# Patient Record
Sex: Male | Born: 1980 | Race: Black or African American | Hispanic: No | Marital: Single | Smoking: Light tobacco smoker
Health system: Southern US, Community
[De-identification: ages and names within clinical notes are randomized; demographics above are authoritative.]

## PROBLEM LIST (undated history)

## (undated) DIAGNOSIS — F1290 Cannabis use, unspecified, uncomplicated: Secondary | ICD-10-CM

---

## 2018-11-04 ENCOUNTER — Emergency Department (HOSPITAL_COMMUNITY): Payer: Self-pay

## 2018-11-04 ENCOUNTER — Encounter (HOSPITAL_COMMUNITY): Payer: Self-pay | Admitting: Emergency Medicine

## 2018-11-04 ENCOUNTER — Inpatient Hospital Stay (HOSPITAL_COMMUNITY)
Admission: EM | Admit: 2018-11-04 | Discharge: 2018-11-07 | DRG: 176 | Disposition: A | Discharge status: Home / Self Care | Payer: Self-pay | Attending: Internal Medicine | Admitting: Internal Medicine

## 2018-11-04 DIAGNOSIS — I129 Hypertensive chronic kidney disease with stage 1 through stage 4 chronic kidney disease, or unspecified chronic kidney disease: Secondary | ICD-10-CM | POA: Diagnosis present

## 2018-11-04 DIAGNOSIS — Z79899 Other long term (current) drug therapy: Secondary | ICD-10-CM

## 2018-11-04 DIAGNOSIS — F1290 Cannabis use, unspecified, uncomplicated: Secondary | ICD-10-CM | POA: Diagnosis present

## 2018-11-04 DIAGNOSIS — Z8249 Family history of ischemic heart disease and other diseases of the circulatory system: Secondary | ICD-10-CM

## 2018-11-04 DIAGNOSIS — Z716 Tobacco abuse counseling: Secondary | ICD-10-CM

## 2018-11-04 DIAGNOSIS — Z823 Family history of stroke: Secondary | ICD-10-CM

## 2018-11-04 DIAGNOSIS — R042 Hemoptysis: Secondary | ICD-10-CM

## 2018-11-04 DIAGNOSIS — I2699 Other pulmonary embolism without acute cor pulmonale: Principal | ICD-10-CM | POA: Diagnosis present

## 2018-11-04 DIAGNOSIS — R911 Solitary pulmonary nodule: Secondary | ICD-10-CM

## 2018-11-04 DIAGNOSIS — N189 Chronic kidney disease, unspecified: Secondary | ICD-10-CM

## 2018-11-04 DIAGNOSIS — Z7151 Drug abuse counseling and surveillance of drug abuser: Secondary | ICD-10-CM

## 2018-11-04 DIAGNOSIS — J90 Pleural effusion, not elsewhere classified: Secondary | ICD-10-CM | POA: Diagnosis present

## 2018-11-04 DIAGNOSIS — M7989 Other specified soft tissue disorders: Secondary | ICD-10-CM

## 2018-11-04 DIAGNOSIS — F17210 Nicotine dependence, cigarettes, uncomplicated: Secondary | ICD-10-CM | POA: Diagnosis present

## 2018-11-04 DIAGNOSIS — N182 Chronic kidney disease, stage 2 (mild): Secondary | ICD-10-CM | POA: Diagnosis present

## 2018-11-04 HISTORY — DX: Cannabis use, unspecified, uncomplicated: F12.90

## 2018-11-04 LAB — POCT I-STAT TROPONIN I
Troponin i, poc: 0 ng/mL (ref 0.00–0.08)
Troponin i, poc: 0 ng/mL (ref 0.00–0.08)

## 2018-11-04 LAB — CBC
HEMATOCRIT: 44.4 % (ref 39.0–52.0)
Hemoglobin: 14.1 g/dL (ref 13.0–17.0)
MCH: 30.3 pg (ref 26.0–34.0)
MCHC: 31.8 g/dL (ref 30.0–36.0)
MCV: 95.5 fL (ref 80.0–100.0)
Platelets: 217 10*3/uL (ref 150–400)
RBC: 4.65 MIL/uL (ref 4.22–5.81)
RDW: 12.7 % (ref 11.5–15.5)
WBC: 9.9 10*3/uL (ref 4.0–10.5)
nRBC: 0 % (ref 0.0–0.2)

## 2018-11-04 LAB — BASIC METABOLIC PANEL
ANION GAP: 8 (ref 5–15)
BUN: 14 mg/dL (ref 6–20)
CO2: 28 mmol/L (ref 22–32)
Calcium: 9.6 mg/dL (ref 8.9–10.3)
Chloride: 103 mmol/L (ref 98–111)
Creatinine, Ser: 1.3 mg/dL — ABNORMAL HIGH (ref 0.61–1.24)
GFR calc Af Amer: >60 mL/min (ref >60)
GFR calc non Af Amer: >60 mL/min (ref >60)
Glucose, Bld: 102 mg/dL — ABNORMAL HIGH (ref 70–99)
Potassium: 4.1 mmol/L (ref 3.5–5.1)
Sodium: 139 mmol/L (ref 135–145)

## 2018-11-04 LAB — PROTIME-INR
INR: 1.09
Prothrombin Time: 14 seconds (ref 11.4–15.2)

## 2018-11-04 LAB — APTT: aPTT: 28 seconds (ref 24–36)

## 2018-11-04 MED ORDER — MORPHINE SULFATE (PF) 4 MG/ML IV SOLN
4.0000 mg | Freq: Once | INTRAVENOUS | Status: AC
  Administered 2018-11-04: 4 mg via INTRAVENOUS
  Filled 2018-11-04: qty 1

## 2018-11-04 MED ORDER — IOPAMIDOL (ISOVUE-370) INJECTION 76%
INTRAVENOUS | Status: AC
  Filled 2018-11-04: qty 100

## 2018-11-04 MED ORDER — SODIUM CHLORIDE (PF) 0.9 % IJ SOLN
INTRAMUSCULAR | Status: AC
  Filled 2018-11-04: qty 50

## 2018-11-04 MED ORDER — KETOROLAC TROMETHAMINE 30 MG/ML IJ SOLN
60.0000 mg | Freq: Once | INTRAMUSCULAR | Status: DC
  Filled 2018-11-04: qty 2

## 2018-11-04 MED ORDER — OXYCODONE HCL 5 MG PO TABS
5.0000 mg | ORAL_TABLET | ORAL | Status: DC | PRN
  Administered 2018-11-04 – 2018-11-05 (×4): 5 mg via ORAL
  Filled 2018-11-04 (×4): qty 1

## 2018-11-04 MED ORDER — MORPHINE SULFATE (PF) 2 MG/ML IV SOLN
2.0000 mg | Freq: Once | INTRAVENOUS | Status: AC
  Administered 2018-11-04: 2 mg via INTRAVENOUS
  Filled 2018-11-04: qty 1

## 2018-11-04 MED ORDER — SENNOSIDES-DOCUSATE SODIUM 8.6-50 MG PO TABS: 1.0000 | ORAL_TABLET | Freq: Every evening | ORAL | Status: DC | PRN

## 2018-11-04 MED ORDER — HEPARIN (PORCINE) 25000 UT/250ML-% IV SOLN
1500.0000 [IU]/h | INTRAVENOUS | Status: DC
  Administered 2018-11-04 – 2018-11-06 (×3): 1500 [IU]/h via INTRAVENOUS
  Filled 2018-11-04 (×3): qty 250

## 2018-11-04 MED ORDER — HYDROMORPHONE HCL 1 MG/ML IJ SOLN
1.0000 mg | Freq: Once | INTRAMUSCULAR | Status: AC
  Administered 2018-11-04: 1 mg via INTRAVENOUS
  Filled 2018-11-04: qty 1

## 2018-11-04 MED ORDER — IOPAMIDOL (ISOVUE-370) INJECTION 76%
100.0000 mL | Freq: Once | INTRAVENOUS | Status: AC | PRN
  Administered 2018-11-04: 100 mL via INTRAVENOUS

## 2018-11-04 MED ORDER — SODIUM CHLORIDE 0.9% FLUSH
3.0000 mL | Freq: Two times a day (BID) | INTRAVENOUS | Status: DC
  Administered 2018-11-05 – 2018-11-07 (×2): 3 mL via INTRAVENOUS

## 2018-11-04 MED ORDER — IOPAMIDOL (ISOVUE-300) INJECTION 61%
100.0000 mL | Freq: Once | INTRAVENOUS | Status: AC | PRN
  Administered 2018-11-04: 100 mL via INTRAVENOUS

## 2018-11-04 MED ORDER — MORPHINE SULFATE (PF) 2 MG/ML IV SOLN
2.0000 mg | INTRAVENOUS | Status: DC | PRN
  Administered 2018-11-05 (×3): 2 mg via INTRAVENOUS
  Filled 2018-11-04 (×3): qty 1

## 2018-11-04 MED ORDER — ONDANSETRON HCL 4 MG/2ML IJ SOLN: 4.0000 mg | Freq: Four times a day (QID) | INTRAMUSCULAR | Status: DC | PRN

## 2018-11-04 MED ORDER — ONDANSETRON HCL 4 MG PO TABS: 4.0000 mg | ORAL_TABLET | Freq: Four times a day (QID) | ORAL | Status: DC | PRN

## 2018-11-04 MED ORDER — HEPARIN BOLUS VIA INFUSION
3000.0000 [IU] | Freq: Once | INTRAVENOUS | Status: AC
  Administered 2018-11-04: 3000 [IU] via INTRAVENOUS
  Filled 2018-11-04: qty 3000

## 2018-11-04 MED ORDER — ACETAMINOPHEN 650 MG RE SUPP: 650.0000 mg | Freq: Four times a day (QID) | RECTAL | Status: DC | PRN

## 2018-11-04 MED ORDER — ACETAMINOPHEN 325 MG PO TABS
650.0000 mg | ORAL_TABLET | Freq: Four times a day (QID) | ORAL | Status: DC | PRN
  Administered 2018-11-05 (×2): 650 mg via ORAL
  Filled 2018-11-04 (×2): qty 2

## 2018-11-04 NOTE — Progress Notes (Signed)
ANTICOAGULATION CONSULT NOTE - Initial Consult  Pharmacy Consult for IV heparin Indication: pulmonary embolus  No Known Allergies  Patient Measurements: Height: 6' 5.5" (196.9 cm) Weight: 115 lb (52.2 kg) IBW/kg (Calculated) : 90.25 Heparin Dosing Weight: acutual  Vital Signs: Temp: 98 F (36.7 C) (12/29 1355) Temp Source: Oral (12/29 1355) BP: 141/80 (12/29 2050) Pulse Rate: 92 (12/29 2050)  Labs: Recent Labs    11/04/18 1409  HGB 14.1  HCT 44.4  PLT 217  CREATININE 1.30*    Estimated Creatinine Clearance: 56.3 mL/min (A) (by C-G formula based on SCr of 1.3 mg/dL (H)).   Medical History: History reviewed. No pertinent past medical history.  Medications:  Scheduled:  . sodium chloride (PF)      . sodium chloride (PF)       Infusions:    Assessment: 37 yoM c/o left CP that radiates to his neck.  CT + LLL PE with right heart strain.  IV heparin for PE.  Baseline labs WNL  Goal of Therapy:  Heparin level 0.3-0.7 units/ml Monitor platelets by anticoagulation protocol: Yes   Plan:  Baseline aPtt and PT/INR STAT Heparin 3000 unit bolus x1 now Start drip at 1500 units/hr Daily CBC/HL Check 1st HL in 6 hours  Lorenza EvangelistGreen, Cheo Selvey R 11/04/2018,10:16 PM

## 2018-11-04 NOTE — ED Provider Notes (Signed)
[facility]-EMERGENCY DEPT Provider Note   CSN: [id number] Arrival date & time: 11/04/18  1342     History   Chief Complaint Chief Complaint  Patient presents with  . Chest Pain    HPI Miguel Franklin is a 36 y.o. male with a PMH of HTN presenting with constant left sided chest pain radiating to the left neck onset 2am today. Patient describes pain as an ache and states pain is worse with laying down and with deep breaths. Patient reports shortness of breath due to pain. Patient states he has taken tylenol and gas X without relief. Patient describes pain originates from under the left breast and radiates up to the left neck. Patient states symptoms began when he was watching TV. Patient denies a cardiac history. Patient denies a family history of cardiac problems. Patient denies trauma or lifting heavy objects. Patient reports tobacco use and marijuana use. Patient also reports occasional alcohol use. Patient states he recently drove 3.5 hours to visit wife's parents. Patient denies recent surgery, leg edema/pain, cough, fever, congestion, abdominal pain, nausea, or vomiting. Patient denies a family history of cardiac problems.  HPI  Past Medical History:  Diagnosis Date  . Marijuana use     Patient Active Problem List   Diagnosis Date Noted  . Pulmonary embolism on left [facility]) 11/04/2018    History reviewed. No pertinent surgical history.      Home Medications    Prior to Admission medications   Medication Sig Start Date End Date Taking? Authorizing Provider  acetaminophen (TYLENOL) 500 MG tablet Take 1,000 mg by mouth every 6 (six) hours as needed for moderate pain.   Yes [provider]    Family History Family History  Problem Relation Age of Onset  . Hypertension Mother   . Stroke Father     Social History Social History   Tobacco Use  . Smoking status: Light Tobacco Smoker    Types: Cigarettes  . Smokeless tobacco: Never Used  .  Tobacco comment: black and mild once a week  Substance Use Topics  . Alcohol use: Not Currently  . Drug use: Yes    Types: Marijuana    Comment: Daily marijuana use     Allergies   Patient has no known allergies.   Review of Systems Review of Systems  Constitutional: Positive for chills. Negative for activity change, appetite change, diaphoresis, fatigue, fever and unexpected weight change.  HENT: Negative for congestion and rhinorrhea.   Respiratory: Positive for cough and shortness of breath. Negative for chest tightness and wheezing.   Cardiovascular: Positive for chest pain. Negative for palpitations and leg swelling.  Gastrointestinal: Negative for abdominal pain, nausea and vomiting.  Endocrine: Negative for cold intolerance and heat intolerance.  Musculoskeletal: Negative for back pain.  Skin: Negative for rash.  Allergic/Immunologic: Negative for immunocompromised state.  Neurological: Negative for dizziness, syncope, weakness and light-headedness.  Psychiatric/Behavioral: Negative for agitation and behavioral problems. The patient is not nervous/anxious.      Physical Exam Updated Vital Signs BP (!) 146/101 (BP Location: Left Arm)   Pulse 94   Temp 98.4 F (36.9 C) (Oral)   Resp (!) 26   Ht 6' 5.5" (1.969 m)   Wt 52.2 kg   SpO2 98%   BMI 13.46 kg/m   Physical Exam Vitals signs and nursing note reviewed.  Constitutional:      General: He is not in acute distress.    Appearance: He is well-developed. He is not  diaphoretic.  HENT:     Head: Normocephalic and atraumatic.  Neck:     Musculoskeletal: Normal range of motion and neck supple.     Vascular: No JVD.  Cardiovascular:     Rate and Rhythm: Normal rate and regular rhythm.     Pulses: Normal pulses.          Radial pulses are 2+ on the right side and 2+ on the left side.       Dorsalis pedis pulses are 2+ on the right side and 2+ on the left side.     Heart sounds: Normal heart sounds. No murmur. No  friction rub. No gallop.   Pulmonary:     Effort: Pulmonary effort is normal. No respiratory distress.     Breath sounds: Examination of the left-middle field reveals decreased breath sounds. Examination of the left-lower field reveals decreased breath sounds. Decreased breath sounds present. No wheezing or rales.  Chest:     Chest wall: No tenderness.  Abdominal:     Palpations: Abdomen is soft.     Tenderness: There is no abdominal tenderness.  Musculoskeletal: Normal range of motion.     Right lower leg: He exhibits no tenderness. No edema.     Left lower leg: He exhibits no tenderness. No edema.  Skin:    General: Skin is warm.     Capillary Refill: Capillary refill takes less than 2 seconds.     Coloration: Skin is not pale.     Findings: No rash.  Neurological:     Mental Status: He is alert.     ED Treatments / Results  Labs (all labs ordered are listed, but only abnormal results are displayed) Labs Reviewed  BASIC METABOLIC PANEL - Abnormal; Notable for the following components:      Result Value   Glucose, Bld 102 (*)    Creatinine, Ser 1.30 (*)    All other components within normal limits  CBC  APTT  PROTIME-INR  HIV ANTIBODY (ROUTINE TESTING W REFLEX)  CBC  BASIC METABOLIC PANEL  TROPONIN I  TROPONIN I  TROPONIN I  I-STAT TROPONIN, ED  POCT I-STAT TROPONIN I  POCT I-STAT TROPONIN I    EKG EKG Interpretation  Date/Time:  Sunday November 04 2018 13:54:04 EST Ventricular Rate:  81 PR Interval:    QRS Duration: 92 QT Interval:  331 QTC Calculation: 385 R Axis:   106 Text Interpretation:  Sinus rhythm Right axis deviation RSR' in V1 or V2, probably normal variant ST elev, probable normal early repol pattern No old tracing to compare Confirmed by Azalia Bilis ([id number]) on 11/04/2018 5:45:33 PM   Radiology Dg Chest 2 View  Result Date: 11/04/2018 CLINICAL DATA:  Pt is c/o LLQ chest pain onset last night. Denies any chest history. Smoker. EXAM: CHEST -  2 VIEW COMPARISON:  None. FINDINGS: Heart size is normal. There is pleural thickening/pleural effusion at the LEFT lung base, associated minimal LEFT LOWER lobe atelectasis. The RIGHT lung is clear. No pulmonary edema. IMPRESSION: Pleural thickening/pleural effusion at the LEFT lung base. Electronically Signed   By: Norva Pavlov M.D.   On: 11/04/2018 14:16   Ct Chest W Contrast  Result Date: 11/04/2018 CLINICAL DATA:  37 year old with acute chest pain and shortness of breath. Abnormal chest x-ray earlier same day questioning LEFT basilar pleural thickening or LEFT pleural effusion. EXAM: CT CHEST WITH CONTRAST TECHNIQUE: Multidetector CT imaging of the chest was performed during intravenous contrast administration. CONTRAST:   ISOVUE-300 IOPAMIDOL INJECTION 61% IV. COMPARISON:  No prior CT. Chest x-ray earlier same day is correlated. FINDINGS: Cardiovascular: Heart size upper normal. The examination was not performed for angiographic evaluation of the pulmonary arteries, but there is a question of a filling defect in the LEFT LOWER LOBE pulmonary artery and several of its branches. No visible coronary atherosclerosis. No pericardial effusion. No visible atherosclerosis involving the thoracic or proximal abdominal aorta or their branches. Mediastinum/Nodes: No pathologically enlarged mediastinal, hilar or axillary lymph nodes. No mediastinal masses. Normal-appearing esophagus. Normal-appearing thyroid gland. Lungs/Pleura: Airspace consolidation involving the POSTERIOR segment of the LEFT LOWER LOBE peripherally with an associated very small LEFT pleural effusion, accounting for the chest x-ray findings. No airspace consolidation elsewhere in either lung. Approximate 6 x 5 mm (6 mm mean) nodule involving the SUPERIOR segment LEFT LOWER LOBE (series 5, image 82). No parenchymal nodules elsewhere in either lung. Minimal scarring involving the lingula and RIGHT MIDDLE LOBE. Central airways patent without  significant bronchial wall thickening. Upper Abdomen: Visualized upper abdomen unremarkable. Musculoskeletal: Regional skeleton unremarkable without acute or significant osseous abnormality. IMPRESSION: 1. While not done as an angiographic evaluation of the pulmonary arteries, there is a possible pulmonary embolus to the LEFT LOWER LOBE pulmonary artery and its branches. 2. Airspace consolidation involving the POSTERIOR segment of the LEFT LOWER LOBE peripherally with an associated very small LEFT pleural effusion, accounting for the chest x-ray findings. This could represent pulmonary infarct or pneumonia. 3. Approximate 6 mm nodule involving the SUPERIOR segment LEFT LOWER LOBE. Non-contrast chest CT at 6-12 months is recommended. If the nodule is stable at time of repeat CT, then future CT at 18-24 months (from today's scan) is considered optional for low-risk patients, but is recommended for high-risk patients. This recommendation follows the consensus statement: Guidelines for Management of Incidental Pulmonary Nodules Detected on CT Images: From the Fleischner Society 2017; Radiology 2017; 284:228-243. Results were telephoned to [facility] Laretha Luepke, [place], at the time of interpretation on 11/04/2018 at 7:53 p.m. Electronically Signed   By: Hulan Saashomas  Lawrence M.D.   On: 11/04/2018 19:55   Ct Angio Chest Pe W/cm &/or Wo Cm  Result Date: 11/04/2018 CLINICAL DATA:  Acute onset of angina and dyspnea. EXAM: CT ANGIOGRAPHY CHEST WITH CONTRAST TECHNIQUE: Multidetector CT imaging of the chest was performed using the standard protocol during bolus administration of intravenous contrast. Multiplanar CT image reconstructions and MIPs were obtained to evaluate the vascular anatomy. CONTRAST:  100mL ISOVUE-370 IOPAMIDOL (ISOVUE-370) INJECTION 76% COMPARISON:  CT of the chest performed earlier today at 7:16 p.m. FINDINGS: Cardiovascular: Pulmonary embolus is noted within the pulmonary artery to the left lower lobe, extending  distally into subsegmental branches. The RV/LV ratio of 0.94 corresponds to right heart strain and at least submassive pulmonary embolus. The heart is normal in size. The thoracic aorta is grossly unremarkable. The great vessels are within normal limits. Mediastinum/Nodes: The mediastinum is unremarkable in appearance. No mediastinal lymphadenopathy is seen. No pericardial effusion is identified. The visualized portions of the thyroid gland are unremarkable. No axillary lymphadenopathy is seen. Lungs/Pleura: There is a pulmonary infarct at the left lower lobe, with trace associated pleural fluid. The previously noted nodule at the left lower lobe (image 77 of 152) is less well characterized on the current study, and measures approximately 5 mm. The right lung appears clear. No pneumothorax is seen. Upper Abdomen: The visualized portions of the liver and spleen are unremarkable. The visualized portions of the pancreas, adrenal glands and kidneys  are grossly unremarkable. Musculoskeletal: No acute osseous abnormalities are identified. The visualized musculature is unremarkable in appearance. Review of the MIP images confirms the above findings. IMPRESSION: 1. Pulmonary embolus within the pulmonary artery to the left lower lobe, extending distally into subsegmental branches. CT evidence of right heart strain (RV/LV Ratio = 0.94) consistent with at least submassive (intermediate risk) PE. The presence of right heart strain has been associated with an increased risk of morbidity and mortality. Please activate Code PE by paging [phone]. 2. Pulmonary infarct at the left lower lobe, with trace associated pleural fluid. 3. Previously noted nodule at the left lower lobe is less well characterized on the current study, and measures approximately 5 mm. Follow-up recommendation was provided on the recent prior CT. Critical Value/emergent results were called by telephone at the time of interpretation on 11/04/2018 at 9:52 pm  to [facility] Maaliyah Adolph PA, who verbally acknowledged these results. Electronically Signed   By: Roanna RaiderJeffery  Chang M.D.   On: 11/04/2018 21:55    Procedures Procedures (including critical care time)  Medications Ordered in ED Medications  sodium chloride (PF) 0.9 % injection (has no administration in time range)  sodium chloride (PF) 0.9 % injection (has no administration in time range)  heparin ADULT infusion 100 units/mL (25000 units/25850mL sodium chloride 0.45%) (1,500 Units/hr Intravenous Transfusing/Transfer 11/04/18 2324)  sodium chloride flush (NS) 0.9 % injection 3 mL (has no administration in time range)  acetaminophen (TYLENOL) tablet 650 mg (has no administration in time range)    Or  acetaminophen (TYLENOL) suppository 650 mg (has no administration in time range)  oxyCODONE (Oxy IR/ROXICODONE) immediate release tablet 5 mg (has no administration in time range)  senna-docusate (Senokot-S) tablet 1 tablet (has no administration in time range)  ondansetron (ZOFRAN) tablet 4 mg (has no administration in time range)    Or  ondansetron (ZOFRAN) injection 4 mg (has no administration in time range)  morphine 2 MG/ML injection 2 mg (has no administration in time range)  morphine 4 MG/ML injection 4 mg (4 mg Intravenous Given 11/04/18 1809)  morphine 2 MG/ML injection 2 mg (2 mg Intravenous Given 11/04/18 1851)  iopamidol (ISOVUE-300) 61 % injection 100 mL (100 mLs Intravenous Contrast Given 11/04/18 1915)  HYDROmorphone (DILAUDID) injection 1 mg (1 mg Intravenous Given 11/04/18 2031)  iopamidol (ISOVUE-370) 76 % injection 100 mL (100 mLs Intravenous Contrast Given 11/04/18 2118)  HYDROmorphone (DILAUDID) injection 1 mg (1 mg Intravenous Given 11/04/18 2212)  heparin bolus via infusion 3,000 Units (3,000 Units Intravenous Bolus from Bag 11/04/18 2300)     Initial Impression / Assessment and Plan / ED Course  I have reviewed the triage vital signs and the nursing notes.  Pertinent labs &  imaging results that were available during my care of the patient were reviewed by me and considered in my medical decision making (see chart for details).  Clinical Course as of Nov 04 2334  Sun Nov 04, 2018  1700 Pleural thickening/pleural effusion at the LEFT lung base noted on CXR.  DG Chest 2 View [AH]  2029 Concern for possible pulmonary embolus to the LEFT LOWER LOBE pulmonary artery and its branches. Discussed case with radiology. Will order CTA.    CT Chest W Contrast [AH]  2031 Airspace consolidation involving the POSTERIOR segment of the LEFT LOWER LOBE peripherally with an associated very small LEFT pleural effusion, accounting for the chest x-ray findings. This could represent pulmonary infarct or pneumonia.    CT Chest W Contrast [AH]  2032 Approximate 6 mm nodule involving the SUPERIOR segment LEFT LOWER LOBE. Non-contrast chest CT at 6-12 months is recommended. If the nodule is stable at time of repeat CT, then future CT at 18-24 months (from today's scan) is considered optional for low-risk patients, but is recommended for high-risk patients.     CT Chest W Contrast [AH]  2159 Pulmonary embolus within the pulmonary artery to the left lower lobe, extending distally into subsegmental branches. CT evidence of right heart strain (RV/LV Ratio = 0.94) consistent with at least submassive (intermediate risk) PE.     CT Angio Chest PE W/Cm &/Or Wo Cm [AH]    Clinical Course User Index [AH] Carlyle Basques P, PA-C   Pt presents with chest pain. Patient was diagnosed with a pulmonary embolism on left lower lobe with evidence of right heart strain on CTA. Patient has been given Morphine and Dilaudid while in the ER for pain control. Ordered heparin for pulmonary embolism treatment. Consulted hospitalist for admission. Dr. Allena Katz has agreed to evaluate and admit patient.   CRITICAL CARE Performed by: Jamaria Amborn P [place] Kuang  Total critical care time: 32 minutes  Critical care time was  exclusive of separately billable procedures and treating other patients.  Critical care was necessary to treat or prevent imminent or life-threatening deterioration.  Critical care was time spent personally by me on the following activities: development of treatment plan with patient and/or surrogate as well as nursing, discussions with consultants, evaluation of patient's response to treatment, examination of patient, obtaining history from patient or surrogate, ordering and performing treatments and interventions, ordering and review of laboratory studies, ordering and review of radiographic studies, pulse oximetry and re-evaluation of patient's condition.  Findings and plan of care discussed with supervising physician Dr. Patria Mane who personally evaluated and examined this patient.  Final Clinical Impressions(s) / ED Diagnoses   Final diagnoses:  Acute pulmonary embolism without acute cor pulmonale, unspecified pulmonary embolism type [facility])    ED Discharge Orders    None       Glade Stanford 11/04/18 2336    Azalia Bilis, MD 11/05/18 2344

## 2018-11-04 NOTE — ED Notes (Signed)
ED TO INPATIENT HANDOFF REPORT  Name/Age/Gender Miguel BoettcherJoe Franklin 37 y.o. male  Code Status    Code Status Orders  (From admission, onward)         Start     Ordered   11/04/18 2311  Full code  Continuous     11/04/18 2311        Code Status History    This patient has a current code status but no historical code status.      Home/SNF/Other Home  Chief Complaint chest pain  Level of Care/Admitting Diagnosis ED Disposition    ED Disposition Condition Comment   Admit  [facility] [[id number]]  Level of Care: Telemetry [5]  Admit to tele based on following criteria: Other see comments  Comments: PE with right heart strain  Diagnosis: Pulmonary embolism on left [facility](HCC) [[id number]]) [[id number]]  Admitting Physician: Charlsie QuestPATEL, VISHAL R [[id number]]  Attending Physician: Charlsie QuestPATEL, VISHAL R [[id number]]  Estimated length of stay: past midnight tomorrow  Certification:: I certify this patient will need inpatient services for at least 2 midnights  PT Class (Do Not Modify): Inpatient [101]  PT Acc Code (Do Not Modify): Private [1]       Medical History Past Medical History:  Diagnosis Date  . Marijuana use     Allergies No Known Allergies  IV Location/Drains/Wounds Patient Lines/Drains/Airways Status   Active Line/Drains/Airways    Name:   Placement date:   Placement time:   Site:   Days:   Peripheral IV 11/04/18 Left Hand   11/04/18    1809    Hand   less than 1   Peripheral IV 11/04/18 Right Antecubital   11/04/18    2050    Antecubital   less than 1          Labs/Imaging Results for orders placed or performed during the hospital encounter of 11/04/18 (from the past 48 hour(s))  Basic metabolic panel     Status: Abnormal   Collection Time: 11/04/18  2:09 PM  Result Value Ref Range   Sodium 139 135 - 145 mmol/L   Potassium 4.1 3.5 - 5.1 mmol/L   Chloride 103 98 - 111 mmol/L   CO2 28 22 - 32 mmol/L   Glucose, Bld 102 (H) 70 - 99 mg/dL   BUN 14 6 - 20 mg/dL    Creatinine, Ser 2.951.30 (H) 0.61 - 1.24 mg/dL   Calcium 9.6 8.9 - 62.110.3 mg/dL   GFR calc non Af Amer >60 >60 mL/min   GFR calc Af Amer >60 >60 mL/min   Anion gap 8 5 - 15    Comment: Performed at [facility], [address] [place], [place] [address]  CBC     Status: None   Collection Time: 11/04/18  2:09 PM  Result Value Ref Range   WBC 9.9 4.0 - 10.5 K/uL   RBC 4.65 4.22 - 5.81 MIL/uL   Hemoglobin 14.1 13.0 - 17.0 g/dL   HCT 78.444.4 69.639.0 - 29.552.0 %   MCV 95.5 80.0 - 100.0 fL   MCH 30.3 26.0 - 34.0 pg   MCHC 31.8 30.0 - 36.0 g/dL   RDW 28.412.7 13.211.5 - 44.015.5 %   Platelets 217 150 - 400 K/uL   nRBC 0.0 0.0 - 0.2 %    Comment: Performed at [facility], [address] [place], [place] [address]  POCT i-Stat troponin I     Status: None   Collection Time: 11/04/18  2:14 PM  Result Value Ref Range   Troponin i, poc 0.00 0.00 - 0.08 ng/mL   Comment 3            Comment: Due to the release kinetics of cTnI, a negative result within the first hours of the onset of symptoms does not rule out myocardial infarction with certainty. If myocardial infarction is still suspected, repeat the test at appropriate intervals.   POCT i-Stat troponin I     Status: None   Collection Time: 11/04/18  4:03 PM  Result Value Ref Range   Troponin i, poc 0.00 0.00 - 0.08 ng/mL   Comment 3            Comment: Due to the release kinetics of cTnI, a negative result within the first hours of the onset of symptoms does not rule out myocardial infarction with certainty. If myocardial infarction is still suspected, repeat the test at appropriate intervals.    Dg Chest 2 View  Result Date: 11/04/2018 CLINICAL DATA:  Pt is c/o LLQ chest pain onset last night. Denies any chest history. Smoker. EXAM: CHEST - 2 VIEW COMPARISON:  None. FINDINGS: Heart size is normal. There is pleural thickening/pleural effusion at the LEFT lung base, associated minimal LEFT LOWER lobe atelectasis.  The RIGHT lung is clear. No pulmonary edema. IMPRESSION: Pleural thickening/pleural effusion at the LEFT lung base. Electronically Signed   By: Norva Pavlov M.D.   On: 11/04/2018 14:16   Ct Chest W Contrast  Result Date: 11/04/2018 CLINICAL DATA:  37 year old with acute chest pain and shortness of breath. Abnormal chest x-ray earlier same day questioning LEFT basilar pleural thickening or LEFT pleural effusion. EXAM: CT CHEST WITH CONTRAST TECHNIQUE: Multidetector CT imaging of the chest was performed during intravenous contrast administration. CONTRAST:  ISOVUE-300 IOPAMIDOL INJECTION 61% IV. COMPARISON:  No prior CT. Chest x-ray earlier same day is correlated. FINDINGS: Cardiovascular: Heart size upper normal. The examination was not performed for angiographic evaluation of the pulmonary arteries, but there is a question of a filling defect in the LEFT LOWER LOBE pulmonary artery and several of its branches. No visible coronary atherosclerosis. No pericardial effusion. No visible atherosclerosis involving the thoracic or proximal abdominal aorta or their branches. Mediastinum/Nodes: No pathologically enlarged mediastinal, hilar or axillary lymph nodes. No mediastinal masses. Normal-appearing esophagus. Normal-appearing thyroid gland. Lungs/Pleura: Airspace consolidation involving the POSTERIOR segment of the LEFT LOWER LOBE peripherally with an associated very small LEFT pleural effusion, accounting for the chest x-ray findings. No airspace consolidation elsewhere in either lung. Approximate 6 x 5 mm (6 mm mean) nodule involving the SUPERIOR segment LEFT LOWER LOBE (series 5, image 82). No parenchymal nodules elsewhere in either lung. Minimal scarring involving the lingula and RIGHT MIDDLE LOBE. Central airways patent without significant bronchial wall thickening. Upper Abdomen: Visualized upper abdomen unremarkable. Musculoskeletal: Regional skeleton unremarkable without acute or significant  osseous abnormality. IMPRESSION: 1. While not done as an angiographic evaluation of the pulmonary arteries, there is a possible pulmonary embolus to the LEFT LOWER LOBE pulmonary artery and its branches. 2. Airspace consolidation involving the POSTERIOR segment of the LEFT LOWER LOBE peripherally with an associated very small LEFT pleural effusion, accounting for the chest x-ray findings. This could represent pulmonary infarct or pneumonia. 3. Approximate 6 mm nodule involving the SUPERIOR segment LEFT LOWER LOBE. Non-contrast chest CT at 6-12 months is recommended. If the nodule is stable at time of repeat CT, then future CT at 18-24 months (from today's scan)  is considered optional for low-risk patients, but is recommended for high-risk patients. This recommendation follows the consensus statement: Guidelines for Management of Incidental Pulmonary Nodules Detected on CT Images: From the Fleischner Society 2017; Radiology 2017; 284:228-243. Results were telephoned to [facility], [place], at the time of interpretation on 11/04/2018 at 7:53 p.m. Electronically Signed   By: Hulan Saashomas  Lawrence M.D.   On: 11/04/2018 19:55   Ct Angio Chest Pe W/cm &/or Wo Cm  Result Date: 11/04/2018 CLINICAL DATA:  Acute onset of angina and dyspnea. EXAM: CT ANGIOGRAPHY CHEST WITH CONTRAST TECHNIQUE: Multidetector CT imaging of the chest was performed using the standard protocol during bolus administration of intravenous contrast. Multiplanar CT image reconstructions and MIPs were obtained to evaluate the vascular anatomy. CONTRAST:  100mL ISOVUE-370 IOPAMIDOL (ISOVUE-370) INJECTION 76% COMPARISON:  CT of the chest performed earlier today at 7:16 p.m. FINDINGS: Cardiovascular: Pulmonary embolus is noted within the pulmonary artery to the left lower lobe, extending distally into subsegmental branches. The RV/LV ratio of 0.94 corresponds to right heart strain and at least submassive pulmonary embolus. The heart is normal in size. The  thoracic aorta is grossly unremarkable. The great vessels are within normal limits. Mediastinum/Nodes: The mediastinum is unremarkable in appearance. No mediastinal lymphadenopathy is seen. No pericardial effusion is identified. The visualized portions of the thyroid gland are unremarkable. No axillary lymphadenopathy is seen. Lungs/Pleura: There is a pulmonary infarct at the left lower lobe, with trace associated pleural fluid. The previously noted nodule at the left lower lobe (image 77 of 152) is less well characterized on the current study, and measures approximately 5 mm. The right lung appears clear. No pneumothorax is seen. Upper Abdomen: The visualized portions of the liver and spleen are unremarkable. The visualized portions of the pancreas, adrenal glands and kidneys are grossly unremarkable. Musculoskeletal: No acute osseous abnormalities are identified. The visualized musculature is unremarkable in appearance. Review of the MIP images confirms the above findings. IMPRESSION: 1. Pulmonary embolus within the pulmonary artery to the left lower lobe, extending distally into subsegmental branches. CT evidence of right heart strain (RV/LV Ratio = 0.94) consistent with at least submassive (intermediate risk) PE. The presence of right heart strain has been associated with an increased risk of morbidity and mortality. Please activate Code PE by paging [phone]. 2. Pulmonary infarct at the left lower lobe, with trace associated pleural fluid. 3. Previously noted nodule at the left lower lobe is less well characterized on the current study, and measures approximately 5 mm. Follow-up recommendation was provided on the recent prior CT. Critical Value/emergent results were called by telephone at the time of interpretation on 11/04/2018 at 9:52 pm to [facility], who verbally acknowledged these results. Electronically Signed   By: Roanna RaiderJeffery  Chang M.D.   On: 11/04/2018 21:55   EKG  Interpretation  Date/Time:  "Sunday November 04 2018 13:54:04 EST Ventricular Rate:  81 PR Interval:    QRS Duration: 92 QT Interval:  331 QTC Calculation: 385 R Axis:   106 Text Interpretation:  Sinus rhythm Right axis deviation RSR' in V1 or V2, probably normal variant ST elev, probable normal early repol pattern No old tracing to compare Confirmed by Campos, Kevin ([id number]) on 11/04/2018 5:45:33 PM   Pending Labs Unresulted Labs (From admission, onward)    Start     Ordered   11/05/18 0500  CBC  Tomorrow morning,   R     12" /29/19 2311   11/05/18 0500  Basic metabolic panel  Tomorrow  morning,   R     11/04/18 2311   11/04/18 2312  Troponin I - Now Then Q6H  Now then every 6 hours,   R     11/04/18 2311   11/04/18 2311  HIV antibody (Routine Testing)  Add-on,   R     11/04/18 2311   11/04/18 2210  APTT  ONCE - STAT,   R     11/04/18 2209   11/04/18 2210  Protime-INR  ONCE - STAT,   R     11/04/18 2209          Vitals/Pain Today's Vitals   11/04/18 1936 11/04/18 2050 11/04/18 2259 11/04/18 2259  BP:  (!) 141/80  (!) 150/88  Pulse:  92  86  Resp:  18  18  Temp:      TempSrc:      SpO2:  97%  99%  Weight:      Height:      PainSc: 9   9      Isolation Precautions No active isolations  Medications Medications  sodium chloride (PF) 0.9 % injection (has no administration in time range)  sodium chloride (PF) 0.9 % injection (has no administration in time range)  heparin ADULT infusion 100 units/mL (25000 units/249mL sodium chloride 0.45%) (1,500 Units/hr Intravenous New Bag/Given 11/04/18 2301)  sodium chloride flush (NS) 0.9 % injection 3 mL (has no administration in time range)  acetaminophen (TYLENOL) tablet 650 mg (has no administration in time range)    Or  acetaminophen (TYLENOL) suppository 650 mg (has no administration in time range)  oxyCODONE (Oxy IR/ROXICODONE) immediate release tablet 5 mg (has no administration in time range)  senna-docusate (Senokot-S)  tablet 1 tablet (has no administration in time range)  ondansetron (ZOFRAN) tablet 4 mg (has no administration in time range)    Or  ondansetron (ZOFRAN) injection 4 mg (has no administration in time range)  morphine 2 MG/ML injection 2 mg (has no administration in time range)  morphine 4 MG/ML injection 4 mg (4 mg Intravenous Given 11/04/18 1809)  morphine 2 MG/ML injection 2 mg (2 mg Intravenous Given 11/04/18 1851)  iopamidol (ISOVUE-300) 61 % injection 100 mL (100 mLs Intravenous Contrast Given 11/04/18 1915)  HYDROmorphone (DILAUDID) injection 1 mg (1 mg Intravenous Given 11/04/18 2031)  iopamidol (ISOVUE-370) 76 % injection 100 mL (100 mLs Intravenous Contrast Given 11/04/18 2118)  HYDROmorphone (DILAUDID) injection 1 mg (1 mg Intravenous Given 11/04/18 2212)  heparin bolus via infusion 3,000 Units (3,000 Units Intravenous Bolus from Bag 11/04/18 2300)    Mobility walks

## 2018-11-04 NOTE — Discharge Instructions (Addendum)
Information on my medicine - XARELTO (rivaroxaban)  This medication education was reviewed with me or my healthcare representative as part of my discharge preparation.  The pharmacist that spoke with me during my hospital stay was:  Mary  WHY WAS XARELTO PRESCRIBED FOR YOU? Xarelto was prescribed to treat blood clots that may have been found in the veins of your legs (deep vein thrombosis) or in your lungs (pulmonary embolism) and to reduce the risk of them occurring again.  What do you need to know about Xarelto? The starting dose is one 15 mg tablet taken TWICE daily with food for the FIRST 21 DAYS then on 11/28/18  the dose is changed to one 20 mg tablet taken ONCE A DAY with your evening meal.  DO NOT stop taking Xarelto without talking to the health care provider who prescribed the medication.  Refill your prescription for 20 mg tablets before you run out.  After discharge, you should have regular check-up appointments with your healthcare provider that is prescribing your Xarelto.  In the future your dose may need to be changed if your kidney function changes by a significant amount.  What do you do if you miss a dose? If you are taking Xarelto TWICE DAILY and you miss a dose, take it as soon as you remember. You may take two 15 mg tablets (total 30 mg) at the same time then resume your regularly scheduled 15 mg twice daily the next day.  If you are taking Xarelto ONCE DAILY and you miss a dose, take it as soon as you remember on the same day then continue your regularly scheduled once daily regimen the next day. Do not take two doses of Xarelto at the same time.   Important Safety Information Xarelto is a blood thinner medicine that can cause bleeding. You should call your healthcare provider right away if you experience any of the following: ? Bleeding from an injury or your nose that does not stop. ? Unusual colored urine (red or dark brown) or unusual colored stools (red or  black). ? Unusual bruising for unknown reasons. ? A serious fall or if you hit your head (even if there is no bleeding).  Some medicines may interact with Xarelto and might increase your risk of bleeding while on Xarelto. To help avoid this, consult your healthcare provider or pharmacist prior to using any new prescription or non-prescription medications, including herbals, vitamins, non-steroidal anti-inflammatory drugs (NSAIDs) and supplements.  This website has more information on Xarelto: [email or web address].   Additional discharge instructions  Please get your medications reviewed and adjusted by your Primary MD.  Please request your Primary MD to go over all Hospital Tests and Procedure/Radiological results at the follow up, please get all Hospital records sent to your Prim MD by signing hospital release before you go home.  If you had Pneumonia of Lung problems at the Hospital: Please get a 2 view Chest X ray done in 6-8 weeks after hospital discharge or sooner if instructed by your Primary MD.  If you have Congestive Heart Failure: Please call your Cardiologist or Primary MD anytime you have any of the following symptoms:  1) 3 pound weight gain in 24 hours or 5 pounds in 1 week  2) shortness of breath, with or without a dry hacking cough  3) swelling in the hands, feet or stomach  4) if you have to sleep on extra pillows at night in order to breathe  Follow cardiac low salt diet  and 1.5 lit/day fluid restriction.  If you have diabetes Accuchecks 4 times/day, Once in AM empty stomach and then before each meal. Log in all results and show them to your primary doctor at your next visit. If any glucose reading is under 80 or above 300 call your primary MD immediately.  If you have Seizure/Convulsions/Epilepsy: Please do not drive, operate heavy machinery, participate in activities at heights or participate in high speed sports until you have seen by Primary MD or a Neurologist  and advised to do so again.  If you had Gastrointestinal Bleeding: Please ask your Primary MD to check a complete blood count within one week of discharge or at your next visit. Your endoscopic/colonoscopic biopsies that are pending at the time of discharge, will also need to followed by your Primary MD.  Get Medicines reviewed and adjusted. Please take all your medications with you for your next visit with your Primary MD  Please request your Primary MD to go over all hospital tests and procedure/radiological results at the follow up, please ask your Primary MD to get all Hospital records sent to his/her office.  If you experience worsening of your admission symptoms, develop shortness of breath, life threatening emergency, suicidal or homicidal thoughts you must seek medical attention immediately by calling 911 or calling your MD immediately  if symptoms less severe.  You must read complete instructions/literature along with all the possible adverse reactions/side effects for all the Medicines you take and that have been prescribed to you. Take any new Medicines after you have completely understood and accpet all the possible adverse reactions/side effects.   Do not drive or operate heavy machinery when taking Pain medications.   Do not take more than prescribed Pain, Sleep and Anxiety Medications  Special Instructions: If you have smoked or chewed Tobacco  in the last 2 yrs please stop smoking, stop any regular Alcohol  and or any Recreational drug use.  Wear Seat belts while driving.  Please note You were cared for by a hospitalist during your hospital stay. If you have any questions about your discharge medications or the care you received while you were in the hospital after you are discharged, you can call the unit and asked to speak with the hospitalist on call if the hospitalist that took care of you is not available. Once you are discharged, your primary care physician will handle  any further medical issues. Please note that NO REFILLS for any discharge medications will be authorized once you are discharged, as it is imperative that you return to your primary care physician (or establish a relationship with a primary care physician if you do not have one) for your aftercare needs so that they can reassess your need for medications and monitor your lab values.  You can reach the hospitalist office at phone [phone] or fax [phone]   If you do not have a primary care physician, you can call [phone] for a physician referral.

## 2018-11-04 NOTE — ED Notes (Signed)
Pt reports L cp that radiates to his neck since 0200 today.  Denies injury.  Pain is worse with movement and inspiration.  He is unable to lay down d/t severe pain.

## 2018-11-04 NOTE — H&P (Signed)
History and Physical    Miguel BoettcherJoe Malek WGN:[id number] DOB: 1981-01-24 DOA: 11/04/2018  PCP: Patient, No Pcp Per  Patient coming from: Family's home  I have personally briefly reviewed patient's [facility]  Chief Complaint: Left-sided chest pain and hemoptysis  HPI: Miguel Franklin is a 37 y.o. male with medical history significant for history of hypertension (not currently on antihypertensives), like tobacco use, and daily marijuana use who presents to the ED with sudden onset left-sided chest pain rating up to his left neck beginning early this morning.  Pain is worse with lying down and deep inspiration.  He has had cough and reports hemoptysis initially about a spoonful, subsequently spots.  He had recent travel having 3.5 hours from [place] to visit his fianc's parents.  [place]  He denies any recent surgery, leg pain or swelling, fever, or known family history of blood clots.  He denies any medication is upset admits to recently taking a male sexual enhancement pill (? Extenze) which he ordered online.  ED Course:  Initial vitals showed BP 145/100, pulse 79, RR 20, temp 23F, SPO2 100% on room air.  CBC and BMP are largely unremarkable.  Point-of-care troponin was 0.00.  PT/INR and PTT were within normal limits.  2 view chest x-ray showed pleural thickening with left lung base pleural effusion.  CT chest with contrast was obtained which showed changes suggestive of PE to the left lower lobe pulmonary artery and its branches.  Airspace consolidation vomiting posterior segment of the left lower lobe was seen with associated very small pleural effusion, likely representing pulmonary infarct versus pneumonia.  An approximate 6 mm nodule involving the superior segment of the left lower lobe was also seen.  CTA chest PE study was obtained which showed a PE within the pulmonary artery to the left lower lobe extending  distally into subsegmental branches.  There is CT evidence of right heart strain consistent with at least submassive PE.  Pulmonary infarct at the left lower lobe was seen with trace associated pleural fluid.  The prior noted left lower lobe lung nodule was less well characterized and measured approximately 5 mm.  Patient was given IV morphine and Dilaudid for pain and heparin infusion was ordered.  The hospitalist service was consulted admit for further management.  Review of Systems: As per HPI otherwise 10 point review of systems negative.    Past Medical History:  Diagnosis Date  . Marijuana use     History reviewed. No pertinent surgical history.   reports that he has been smoking cigarettes. He has never used smokeless tobacco. He reports previous alcohol use. He reports current drug use. Drug: Marijuana.  No Known Allergies  Family History  Problem Relation Age of Onset  . Hypertension Mother   . Stroke Father      Prior to Admission medications   Medication Sig Start Date End Date Taking? Authorizing Provider  acetaminophen (TYLENOL) 500 MG tablet Take 1,000 mg by mouth every 6 (six) hours as needed for moderate pain.   Yes [provider]    Physical Exam: Vitals:   11/04/18 1759 11/04/18 2050 11/04/18 2259 11/04/18 2334  BP: (!) 146/92 (!) 141/80 (!) 150/88 (!) 146/101  Pulse: 92 92 86 94  Resp: 20 18 18  (!) 26  Temp:    98.4 F (36.9 C)  TempSrc:    Oral  SpO2: 100% 97% 99% 98%  Weight:    95.6 kg  Height:    6\' 5"  (1.956 m)    Constitutional: Young man sitting up in bed, appears uncomfortable holding the left side of his chest. Eyes: PERRL, lids and conjunctivae normal ENMT: Mucous membranes are moist. Posterior pharynx clear of any exudate or lesions.Normal dentition.  Neck: normal, supple, no masses. Respiratory: clear to auscultation bilaterally, no wheezing, no crackles. Normal respiratory effort. No accessory muscle use.  Cardiovascular:  Regular rate and rhythm, no murmurs / rubs / gallops. No extremity edema. 2+ pedal pulses. Abdomen: no tenderness, no masses palpated. No hepatosplenomegaly. Bowel sounds positive.  Musculoskeletal: Tender to palpation over the left chest wall.  No swelling or tenderness at the calves.  No clubbing / cyanosis. No joint deformity upper and lower extremities. Good ROM, no contractures. Normal muscle tone.  Skin: no rashes, lesions, ulcers. No induration Neurologic: CN 2-12 grossly intact. Sensation intact. Strength 5/5 in all 4.  Psychiatric: Normal judgment and insight. Alert and oriented x 3. Normal mood.     Labs on Admission: I have personally reviewed following labs and imaging studies  CBC: Recent Labs  Lab 11/04/18 1409  WBC 9.9  HGB 14.1  HCT 44.4  MCV 95.5  PLT 217   Basic Metabolic Panel: Recent Labs  Lab 11/04/18 1409  NA 139  K 4.1  CL 103  CO2 28  GLUCOSE 102*  BUN 14  CREATININE 1.30*  CALCIUM 9.6   GFR: Estimated Creatinine Clearance: 96.1 mL/min (A) (by C-G formula based on SCr of 1.3 mg/dL (H)). Liver Function Tests: No results for input(s): AST, ALT, ALKPHOS, BILITOT, PROT, ALBUMIN in the last 168 hours. No results for input(s): LIPASE, AMYLASE in the last 168 hours. No results for input(s): AMMONIA in the last 168 hours. Coagulation Profile: Recent Labs  Lab 11/04/18 2221  INR 1.09   Cardiac Enzymes: No results for input(s): CKTOTAL, CKMB, CKMBINDEX, TROPONINI in the last 168 hours. BNP (last 3 results) No results for input(s): PROBNP in the last 8760 hours. HbA1C: No results for input(s): HGBA1C in the last 72 hours. CBG: No results for input(s): GLUCAP in the last 168 hours. Lipid Profile: No results for input(s): CHOL, HDL, LDLCALC, TRIG, CHOLHDL, LDLDIRECT in the last 72 hours. Thyroid Function Tests: No results for input(s): TSH, T4TOTAL, FREET4, T3FREE, THYROIDAB in the last 72 hours. Anemia Panel: No results for input(s): VITAMINB12,  FOLATE, FERRITIN, TIBC, IRON, RETICCTPCT in the last 72 hours. Urine analysis: No results found for: COLORURINE, APPEARANCEUR, LABSPEC, PHURINE, GLUCOSEU, HGBUR, BILIRUBINUR, KETONESUR, PROTEINUR, UROBILINOGEN, NITRITE, LEUKOCYTESUR  Radiological Exams on Admission: Dg Chest 2 View  Result Date: 11/04/2018 CLINICAL DATA:  Pt is c/o LLQ chest pain onset last night. Denies any chest history. Smoker. EXAM: CHEST - 2 VIEW COMPARISON:  None. FINDINGS: Heart size is normal. There is pleural thickening/pleural effusion at the LEFT lung base, associated minimal LEFT LOWER lobe atelectasis. The RIGHT lung is clear. No pulmonary edema. IMPRESSION: Pleural thickening/pleural effusion at the LEFT lung base. Electronically Signed   By: Norva Pavlov M.D.   On: 11/04/2018 14:16   Ct Chest W Contrast  Result Date: 11/04/2018 CLINICAL DATA:  37 year old with acute chest pain and shortness of breath. Abnormal chest x-ray earlier same day questioning LEFT basilar pleural thickening or LEFT pleural effusion. EXAM: CT CHEST WITH CONTRAST TECHNIQUE: Multidetector CT imaging of the chest was performed during intravenous contrast administration. CONTRAST:  ISOVUE-300 IOPAMIDOL INJECTION 61% IV. COMPARISON:  No prior CT.  Chest x-ray earlier same day is correlated. FINDINGS: Cardiovascular: Heart size upper normal. The examination was not performed for angiographic evaluation of the pulmonary arteries, but there is a question of a filling defect in the LEFT LOWER LOBE pulmonary artery and several of its branches. No visible coronary atherosclerosis. No pericardial effusion. No visible atherosclerosis involving the thoracic or proximal abdominal aorta or their branches. Mediastinum/Nodes: No pathologically enlarged mediastinal, hilar or axillary lymph nodes. No mediastinal masses. Normal-appearing esophagus. Normal-appearing thyroid gland. Lungs/Pleura: Airspace consolidation involving the POSTERIOR segment of the LEFT  LOWER LOBE peripherally with an associated very small LEFT pleural effusion, accounting for the chest x-ray findings. No airspace consolidation elsewhere in either lung. Approximate 6 x 5 mm (6 mm mean) nodule involving the SUPERIOR segment LEFT LOWER LOBE (series 5, image 82). No parenchymal nodules elsewhere in either lung. Minimal scarring involving the lingula and RIGHT MIDDLE LOBE. Central airways patent without significant bronchial wall thickening. Upper Abdomen: Visualized upper abdomen unremarkable. Musculoskeletal: Regional skeleton unremarkable without acute or significant osseous abnormality. IMPRESSION: 1. While not done as an angiographic evaluation of the pulmonary arteries, there is a possible pulmonary embolus to the LEFT LOWER LOBE pulmonary artery and its branches. 2. Airspace consolidation involving the POSTERIOR segment of the LEFT LOWER LOBE peripherally with an associated very small LEFT pleural effusion, accounting for the chest x-ray findings. This could represent pulmonary infarct or pneumonia. 3. Approximate 6 mm nodule involving the SUPERIOR segment LEFT LOWER LOBE. Non-contrast chest CT at 6-12 months is recommended. If the nodule is stable at time of repeat CT, then future CT at 18-24 months (from today's scan) is considered optional for low-risk patients, but is recommended for high-risk patients. This recommendation follows the consensus statement: Guidelines for Management of Incidental Pulmonary Nodules Detected on CT Images: From the Fleischner Society 2017; Radiology 2017; 284:228-243. Results were telephoned to [facility], [place], at the time of interpretation on 11/04/2018 at 7:53 p.m. Electronically Signed   By: Hulan Saashomas  Lawrence M.D.   On: 11/04/2018 19:55   Ct Angio Chest Pe W/cm &/or Wo Cm  Result Date: 11/04/2018 CLINICAL DATA:  Acute onset of angina and dyspnea. EXAM: CT ANGIOGRAPHY CHEST WITH CONTRAST TECHNIQUE: Multidetector CT imaging of the chest was performed using  the standard protocol during bolus administration of intravenous contrast. Multiplanar CT image reconstructions and MIPs were obtained to evaluate the vascular anatomy. CONTRAST:  100mL ISOVUE-370 IOPAMIDOL (ISOVUE-370) INJECTION 76% COMPARISON:  CT of the chest performed earlier today at 7:16 p.m. FINDINGS: Cardiovascular: Pulmonary embolus is noted within the pulmonary artery to the left lower lobe, extending distally into subsegmental branches. The RV/LV ratio of 0.94 corresponds to right heart strain and at least submassive pulmonary embolus. The heart is normal in size. The thoracic aorta is grossly unremarkable. The great vessels are within normal limits. Mediastinum/Nodes: The mediastinum is unremarkable in appearance. No mediastinal lymphadenopathy is seen. No pericardial effusion is identified. The visualized portions of the thyroid gland are unremarkable. No axillary lymphadenopathy is seen. Lungs/Pleura: There is a pulmonary infarct at the left lower lobe, with trace associated pleural fluid. The previously noted nodule at the left lower lobe (image 77 of 152) is less well characterized on the current study, and measures approximately 5 mm. The right lung appears clear. No pneumothorax is seen. Upper Abdomen: The visualized portions of the liver and spleen are unremarkable. The visualized portions of the pancreas, adrenal glands and kidneys are grossly unremarkable. Musculoskeletal: No acute osseous abnormalities are identified.  The visualized musculature is unremarkable in appearance. Review of the MIP images confirms the above findings. IMPRESSION: 1. Pulmonary embolus within the pulmonary artery to the left lower lobe, extending distally into subsegmental branches. CT evidence of right heart strain (RV/LV Ratio = 0.94) consistent with at least submassive (intermediate risk) PE. The presence of right heart strain has been associated with an increased risk of morbidity and mortality. Please activate Code  PE by paging [phone]. 2. Pulmonary infarct at the left lower lobe, with trace associated pleural fluid. 3. Previously noted nodule at the left lower lobe is less well characterized on the current study, and measures approximately 5 mm. Follow-up recommendation was provided on the recent prior CT. Critical Value/emergent results were called by telephone at the time of interpretation on 11/04/2018 at 9:52 pm to [facility] PA, who verbally acknowledged these results. Electronically Signed   By: Roanna Raider M.D.   On: 11/04/2018 21:55    EKG: Independently reviewed. Sinus rhythm, right axis deviation, S wave in lead I, T wave inversion lead III.  Assessment/Plan Active Problems:   Pulmonary embolism on left [facility])   Lung nodule seen on imaging study   Miguel Franklin is a 37 y.o. male with medical history significant for history of hypertension (not currently on antihypertensives), like tobacco use, and daily marijuana use who presents to the ED with sudden onset left-sided chest pain rating up to his left neck found to have a pulmonary embolus of the left pulmonary artery with CT evidence of right heart strain and left lower lobe pulmonary infarct.  Left-sided pulmonary embolism with right heart strain and left lower lobe infarct: Only significant risk factors seem to be recent travel.  He is unsure of family history, but reports his mother had either varicose veins versus DVT.  He is currently hemodynamically stable without hypotension, hypoxia, or tachycardia. -Continue heparin infusion -Obtain echocardiogram -Pain control with as needed OxyIR, PRN IV morphine for severe pain  Left lower lobe pulmonary nodule: 5 to 6 mm superior segment left lower lobe pulmonary nodule noted on CT imaging.  Noncontrast chest CT at 6-12 months recommended.  DVT prophylaxis: Heparin gtt Code Status: Code Family Communication: Discussed with patient and fianc at bedside Disposition Plan: Pending clinical  progress, will likely need at least 2 days of IV heparin treatment Consults called: None Admission status: Inpatient   Darreld Mclean MD [facility] Pager [phone]  If 7PM-7AM, please contact night-coverage [email or web address] Password [facility]  11/04/2018, 11:53 PM

## 2018-11-04 NOTE — ED Triage Notes (Signed)
Pt c/o left sided chest pains that started last night and radiate to left side of neck.

## 2018-11-05 ENCOUNTER — Other Ambulatory Visit: Payer: Self-pay

## 2018-11-05 ENCOUNTER — Inpatient Hospital Stay (HOSPITAL_BASED_OUTPATIENT_CLINIC_OR_DEPARTMENT_OTHER): Payer: Self-pay

## 2018-11-05 ENCOUNTER — Inpatient Hospital Stay (HOSPITAL_COMMUNITY): Payer: Self-pay

## 2018-11-05 DIAGNOSIS — R911 Solitary pulmonary nodule: Secondary | ICD-10-CM

## 2018-11-05 DIAGNOSIS — Z86711 Personal history of pulmonary embolism: Secondary | ICD-10-CM

## 2018-11-05 DIAGNOSIS — I2699 Other pulmonary embolism without acute cor pulmonale: Secondary | ICD-10-CM

## 2018-11-05 LAB — CBC
HEMATOCRIT: 41.2 % (ref 39.0–52.0)
Hemoglobin: 13.3 g/dL (ref 13.0–17.0)
MCH: 30.2 pg (ref 26.0–34.0)
MCHC: 32.3 g/dL (ref 30.0–36.0)
MCV: 93.4 fL (ref 80.0–100.0)
Platelets: 226 10*3/uL (ref 150–400)
RBC: 4.41 MIL/uL (ref 4.22–5.81)
RDW: 12.8 % (ref 11.5–15.5)
WBC: 11.8 10*3/uL — ABNORMAL HIGH (ref 4.0–10.5)
nRBC: 0 % (ref 0.0–0.2)

## 2018-11-05 LAB — HEPARIN LEVEL (UNFRACTIONATED)
HEPARIN UNFRACTIONATED: 0.53 [IU]/mL (ref 0.30–0.70)
Heparin Unfractionated: 0.5 IU/mL (ref 0.30–0.70)

## 2018-11-05 LAB — HIV ANTIBODY (ROUTINE TESTING W REFLEX): HIV Screen 4th Generation wRfx: NONREACTIVE

## 2018-11-05 LAB — ECHOCARDIOGRAM COMPLETE
Height: 77 in
Weight: 3372.16 oz

## 2018-11-05 LAB — BASIC METABOLIC PANEL
Anion gap: 10 (ref 5–15)
BUN: 14 mg/dL (ref 6–20)
CO2: 27 mmol/L (ref 22–32)
Calcium: 9.5 mg/dL (ref 8.9–10.3)
Chloride: 98 mmol/L (ref 98–111)
Creatinine, Ser: 1.37 mg/dL — ABNORMAL HIGH (ref 0.61–1.24)
GFR calc Af Amer: >60 mL/min (ref >60)
GFR calc non Af Amer: >60 mL/min (ref >60)
GLUCOSE: 124 mg/dL — AB (ref 70–99)
Potassium: 4.4 mmol/L (ref 3.5–5.1)
Sodium: 135 mmol/L (ref 135–145)

## 2018-11-05 LAB — TROPONIN I
Troponin I: <0.03 ng/mL (ref <0.03)
Troponin I: <0.03 ng/mL (ref <0.03)
Troponin I: <0.03 ng/mL (ref <0.03)

## 2018-11-05 MED ORDER — SODIUM CHLORIDE 0.9 % IV SOLN
INTRAVENOUS | Status: DC
  Administered 2018-11-05 – 2018-11-06 (×2): via INTRAVENOUS

## 2018-11-05 MED ORDER — OXYCODONE HCL 5 MG PO TABS
10.0000 mg | ORAL_TABLET | ORAL | Status: DC | PRN
  Administered 2018-11-05 – 2018-11-06 (×5): 10 mg via ORAL
  Filled 2018-11-05 (×5): qty 2

## 2018-11-05 MED ORDER — ACETAMINOPHEN 325 MG PO TABS
650.0000 mg | ORAL_TABLET | ORAL | Status: DC | PRN
  Administered 2018-11-05 – 2018-11-07 (×4): 650 mg via ORAL
  Filled 2018-11-05 (×4): qty 2

## 2018-11-05 MED ORDER — HYDROMORPHONE HCL 1 MG/ML IJ SOLN
1.0000 mg | INTRAMUSCULAR | Status: DC | PRN
  Administered 2018-11-05 – 2018-11-06 (×2): 1 mg via INTRAVENOUS
  Filled 2018-11-05 (×2): qty 1

## 2018-11-05 MED ORDER — ACETAMINOPHEN 650 MG RE SUPP: 650.0000 mg | RECTAL | Status: DC | PRN

## 2018-11-05 NOTE — Progress Notes (Signed)
PROGRESS NOTE   Miguel Franklin  ZOX:[id number]    DOB: April 20, 1981    DOA: 11/04/2018  PCP: Patient, No Pcp Per   I have briefly reviewed patients previous medical records in [facility].  Brief Narrative:  37 year old male, works as a landscaper,PMH of HTN not on antihypertensives, tobacco and THC use, presented to [facility] ED on 11/04/2018 due to sudden onset of left-sided pleuritic type of chest pain, cough with an episode of minimal hemoptysis, some dyspnea.  Recent travels (short distance flights, car rides up to 3-1/2 hours and a cruise).  Admitted for submassive pulmonary embolism with right heart strain, left lower lobe infarct.  Initiated on IV heparin infusion.  Slowly improving.   Assessment & Plan:   Principal Problem:   Pulmonary embolism on left [facility]) Active Problems:   Lung nodule seen on imaging study   1. Left lower lobe acute submassive pulmonary embolism with right heart strain by CT and left lower lobe pulmonary infarct: Unclear precipitating factors.  Patient not sure if his mother had history of DVT and will verify.  Recent travels as indicated above.  As per admitting physician, admitted recently taking a male sexual enhancement pill (?  Extenze) which he ordered online.  Hemodynamically stable and not hypoxic.  Initiated on IV heparin infusion.  TTE: LVEF 60-65%.  Right ventricle cavity size was normal with normal systolic function.  Troponin cycled and negative.  Continue IV heparin infusion overnight to monitor closely if he has recurrent hemoptysis.  I discussed in detail with patient and his fiance at bedside regarding options for oral anticoagulation including warfarin with Lovenox bridge, Eliquis/Xarelto & Pradaxa, risks and benefits of each.  They are leaning towards NOAC's and will consider transitioning in a.m.  Follow lower extremity venous Doppler results.  Duration of anticoagulation: At least 6 months.  Depending on family history, may need further  evaluation as outpatient including hematology consultation to determine if he has hypercoagulable state in which case he will need prolonged anticoagulation.  Pain control.  Improving. 2. Left lower lobe pulmonary nodule: Outpatient follow-up as per radiology recommendations. 3. Tobacco and THC use: Cessation counseled. 4. Stage II chronic kidney disease: Creatinine 1.3 and stable.  Baseline creatinine not known.  Gentle IV fluid hydration and follow BMP in a.m. 5. Essential hypertension: Not on medications at home.  Mild intermittent BP elevations, likely related to pain.  For now observe.  If persistently elevated, consider antihypertensives.   DVT prophylaxis: Currently on IV heparin infusion Code Status: Full Family Communication: Discussed in detail with patient's fianc at bedside. Disposition: Home pending clinical improvement.   Consultants:  None  Procedures:  None  Antimicrobials:  None   Subjective: Patient reports he feels slightly better.  Left lower pleuritic chest pain has slightly improved, rates at 7/10 in severity, worse with deep breaths.  Has not coughed up any further blood since ED.  Dyspnea improved.  ROS: As above, otherwise negative.  Objective:  Vitals:   11/04/18 2259 11/04/18 2334 11/05/18 0425 11/05/18 1414  BP: (!) 150/88 (!) 146/101 123/81 135/85  Pulse: 86 94 89 81  Resp: 18 (!) 26    Temp:  98.4 F (36.9 C) 99.6 F (37.6 C) 98.7 F (37.1 C)  TempSrc:  Oral Oral Oral  SpO2: 99% 98% 95% 94%  Weight:  95.6 kg    Height:  6\' 5"  (1.956 m)      Examination:  General exam: Does not young male, well built  and nourished, sitting up comfortably in bed Respiratory system: Diminished breath sounds in left base with occasional crackles but no pleural rub.  Rest of lung fields clear to auscultation. Respiratory effort normal. Cardiovascular system: S1 & S2 heard, RRR. No JVD, murmurs, rubs, gallops or clicks. No pedal edema.  Telemetry personally  reviewed: Sinus rhythm. Gastrointestinal system: Abdomen is nondistended, soft and nontender. No organomegaly or masses felt. Normal bowel sounds heard. Central nervous system: Alert and oriented. No focal neurological deficits. Extremities: Symmetric 5 x 5 power.  Left leg?  Appears slightly asymmetrically larger than right without any other acute findings but patient states that this is chronic for him. Skin: No rashes, lesions or ulcers Psychiatry: Judgement and insight appear normal. Mood & affect appropriate.     Data Reviewed: I have personally reviewed following labs and imaging studies  CBC: Recent Labs  Lab 11/04/18 1409 11/05/18 0437  WBC 9.9 11.8*  HGB 14.1 13.3  HCT 44.4 41.2  MCV 95.5 93.4  PLT 217 226   Basic Metabolic Panel: Recent Labs  Lab 11/04/18 1409 11/05/18 0437  NA 139 135  K 4.1 4.4  CL 103 98  CO2 28 27  GLUCOSE 102* 124*  BUN 14 14  CREATININE 1.30* 1.37*  CALCIUM 9.6 9.5   Liver Function Tests: No results for input(s): AST, ALT, ALKPHOS, BILITOT, PROT, ALBUMIN in the last 168 hours. Coagulation Profile: Recent Labs  Lab 11/04/18 2221  INR 1.09   Cardiac Enzymes: Recent Labs  Lab 11/05/18 0001 11/05/18 0437 11/05/18 1121  TROPONINI <0.03 <0.03 <0.03    Radiology Studies: Dg Chest 2 View  Result Date: 11/04/2018 CLINICAL DATA:  Pt is c/o LLQ chest pain onset last night. Denies any chest history. Smoker. EXAM: CHEST - 2 VIEW COMPARISON:  None. FINDINGS: Heart size is normal. There is pleural thickening/pleural effusion at the LEFT lung base, associated minimal LEFT LOWER lobe atelectasis. The RIGHT lung is clear. No pulmonary edema. IMPRESSION: Pleural thickening/pleural effusion at the LEFT lung base. Electronically Signed   By: Norva PavlovElizabeth  Brown M.D.   On: 11/04/2018 14:16   Ct Chest W Contrast  Result Date: 11/04/2018 CLINICAL DATA:  37 year old with acute chest pain and shortness of breath. Abnormal chest x-ray earlier same day  questioning LEFT basilar pleural thickening or LEFT pleural effusion. EXAM: CT CHEST WITH CONTRAST TECHNIQUE: Multidetector CT imaging of the chest was performed during intravenous contrast administration. CONTRAST:  100mL ISOVUE-300 IOPAMIDOL INJECTION 61% IV. COMPARISON:  No prior CT. Chest x-ray earlier same day is correlated. FINDINGS: Cardiovascular: Heart size upper normal. The examination was not performed for angiographic evaluation of the pulmonary arteries, but there is a question of a filling defect in the LEFT LOWER LOBE pulmonary artery and several of its branches. No visible coronary atherosclerosis. No pericardial effusion. No visible atherosclerosis involving the thoracic or proximal abdominal aorta or their branches. Mediastinum/Nodes: No pathologically enlarged mediastinal, hilar or axillary lymph nodes. No mediastinal masses. Normal-appearing esophagus. Normal-appearing thyroid gland. Lungs/Pleura: Airspace consolidation involving the POSTERIOR segment of the LEFT LOWER LOBE peripherally with an associated very small LEFT pleural effusion, accounting for the chest x-ray findings. No airspace consolidation elsewhere in either lung. Approximate 6 x 5 mm (6 mm mean) nodule involving the SUPERIOR segment LEFT LOWER LOBE (series 5, image 82). No parenchymal nodules elsewhere in either lung. Minimal scarring involving the lingula and RIGHT MIDDLE LOBE. Central airways patent without significant bronchial wall thickening. Upper Abdomen: Visualized upper abdomen unremarkable. Musculoskeletal: Regional skeleton  unremarkable without acute or significant osseous abnormality. IMPRESSION: 1. While not done as an angiographic evaluation of the pulmonary arteries, there is a possible pulmonary embolus to the LEFT LOWER LOBE pulmonary artery and its branches. 2. Airspace consolidation involving the POSTERIOR segment of the LEFT LOWER LOBE peripherally with an associated very small LEFT pleural effusion,  accounting for the chest x-ray findings. This could represent pulmonary infarct or pneumonia. 3. Approximate 6 mm nodule involving the SUPERIOR segment LEFT LOWER LOBE. Non-contrast chest CT at 6-12 months is recommended. If the nodule is stable at time of repeat CT, then future CT at 18-24 months (from today's scan) is considered optional for low-risk patients, but is recommended for high-risk patients. This recommendation follows the consensus statement: Guidelines for Management of Incidental Pulmonary Nodules Detected on CT Images: From the Fleischner Society 2017; Radiology 2017; 284:228-243. Results were telephoned to [facility], [place], at the time of interpretation on 11/04/2018 at 7:53 p.m. Electronically Signed   By: Hulan Saas M.D.   On: 11/04/2018 19:55   Ct Angio Chest Pe W/cm &/or Wo Cm  Result Date: 11/04/2018 CLINICAL DATA:  Acute onset of angina and dyspnea. EXAM: CT ANGIOGRAPHY CHEST WITH CONTRAST TECHNIQUE: Multidetector CT imaging of the chest was performed using the standard protocol during bolus administration of intravenous contrast. Multiplanar CT image reconstructions and MIPs were obtained to evaluate the vascular anatomy. CONTRAST:  ISOVUE-370 IOPAMIDOL (ISOVUE-370) INJECTION 76% COMPARISON:  CT of the chest performed earlier today at 7:16 p.m. FINDINGS: Cardiovascular: Pulmonary embolus is noted within the pulmonary artery to the left lower lobe, extending distally into subsegmental branches. The RV/LV ratio of 0.94 corresponds to right heart strain and at least submassive pulmonary embolus. The heart is normal in size. The thoracic aorta is grossly unremarkable. The great vessels are within normal limits. Mediastinum/Nodes: The mediastinum is unremarkable in appearance. No mediastinal lymphadenopathy is seen. No pericardial effusion is identified. The visualized portions of the thyroid gland are unremarkable. No axillary lymphadenopathy is seen. Lungs/Pleura: There is a  pulmonary infarct at the left lower lobe, with trace associated pleural fluid. The previously noted nodule at the left lower lobe (image 77 of 152) is less well characterized on the current study, and measures approximately 5 mm. The right lung appears clear. No pneumothorax is seen. Upper Abdomen: The visualized portions of the liver and spleen are unremarkable. The visualized portions of the pancreas, adrenal glands and kidneys are grossly unremarkable. Musculoskeletal: No acute osseous abnormalities are identified. The visualized musculature is unremarkable in appearance. Review of the MIP images confirms the above findings. IMPRESSION: 1. Pulmonary embolus within the pulmonary artery to the left lower lobe, extending distally into subsegmental branches. CT evidence of right heart strain (RV/LV Ratio = 0.94) consistent with at least submassive (intermediate risk) PE. The presence of right heart strain has been associated with an increased risk of morbidity and mortality. Please activate Code PE by paging [phone]. 2. Pulmonary infarct at the left lower lobe, with trace associated pleural fluid. 3. Previously noted nodule at the left lower lobe is less well characterized on the current study, and measures approximately 5 mm. Follow-up recommendation was provided on the recent prior CT. Critical Value/emergent results were called by telephone at the time of interpretation on 11/04/2018 at 9:52 pm to [facility] PA, who verbally acknowledged these results. Electronically Signed   By: Roanna Raider M.D.   On: 11/04/2018 21:55        Scheduled Meds: . sodium  chloride flush  3 mL Intravenous Q12H   Continuous Infusions: . heparin 1,500 Units/hr (11/05/18 1401)     LOS: 1 day     Marcellus ScottAnand Marti Mclane, MD, FACP, [facility]. [facility] Pager [phone]  If 7PM-7AM, please contact night-coverage [email or web address] Password [email or web address] 11/05/2018, 2:40 PM

## 2018-11-05 NOTE — Progress Notes (Signed)
ANTICOAGULATION CONSULT NOTE -  Consult  Pharmacy Consult for IV heparin Indication: pulmonary embolus  No Known Allergies  Patient Measurements: Height: 6\' 5"  (195.6 cm) Weight: 210 lb 12.2 oz (95.6 kg) IBW/kg (Calculated) : 89.1 Heparin Dosing Weight = TBW = 95 kg  Vital Signs: Temp: 99.6 F (37.6 C) (12/30 0425) Temp Source: Oral (12/30 0425) BP: 123/81 (12/30 0425) Pulse Rate: 89 (12/30 0425)  Labs: Recent Labs    11/04/18 1409 11/04/18 2221 11/05/18 0001 11/05/18 0437 11/05/18 1121  HGB 14.1  --   --  13.3  --   HCT 44.4  --   --  41.2  --   PLT 217  --   --  226  --   APTT  --  28  --   --   --   LABPROT  --  14.0  --   --   --   INR  --  1.09  --   --   --   HEPARINUNFRC  --   --   --  0.50 0.53  CREATININE 1.30*  --   --  1.37*  --   TROPONINI  --   --  <0.03 <0.03 <0.03    Estimated Creatinine Clearance: 91.2 mL/min (A) (by C-G formula based on SCr of 1.37 mg/dL (H)).   Medical History: Past Medical History:  Diagnosis Date  . Marijuana use     Medications:  Scheduled:  . sodium chloride flush  3 mL Intravenous Q12H   Infusions:  . heparin 1,500 Units/hr (11/04/18 2304)    Assessment: 37 yoM c/o left CP that radiates to his neck.   CT + for LLL PE with right heart strain.  IV heparin for PE.  Baseline labs WNL  Today, 12/30  CBC - stable, WNL  Confirmatory HL = 0.53 is therapeutic on heparin infusion of 1500 units/hr  Per discussion with RN - no issues with heparin infusion. No signs/symptoms of bleeding  Goal of Therapy:  Heparin level 0.3-0.7 units/ml Monitor platelets by anticoagulation protocol: Yes   Plan:   Continue heparin drip at 1500 units/hr  Daily CBC and HL per protocol  Monitor for signs/symptoms of bleeding or thrombosis  Cindi CarbonMary M Makya Yurko, PharmD 11/05/2018,12:58 PM

## 2018-11-05 NOTE — Progress Notes (Signed)
Bilateral lower extremity venous duplex completed. Preliminary results - There is no evidence of a DVT or Baker's cyst. Miguel Franklin,RVS 11/05/2018, 3:14 pm

## 2018-11-05 NOTE — Progress Notes (Signed)
Today, 12/30 0437 HL= 0.50 at goal, drawn at ~ 5.5 hrs vs 6 hrs, no bleeding per this RN, infusion was paused for < 2 mins per this RN.   Goal of Therapy:  Heparin level 0.3-0.7 units/ml ( Currently at 0.5 units/ML) Monitor platelets by anticoagulation protocol: Yes   Plan:   Continue heparin drip at 1500 units/hr per pharmacy Recheck HL today to ensure stays in range as bolus wears off Daily CBC/HL  Will continue to monitor

## 2018-11-05 NOTE — Progress Notes (Signed)
ANTICOAGULATION CONSULT NOTE -  Consult  Pharmacy Consult for IV heparin Indication: pulmonary embolus  No Known Allergies  Patient Measurements: Height: 6\' 5"  (195.6 cm) Weight: 210 lb 12.2 oz (95.6 kg) IBW/kg (Calculated) : 89.1 Heparin Dosing Weight: acutual  Vital Signs: Temp: 99.6 F (37.6 C) (12/30 0425) Temp Source: Oral (12/30 0425) BP: 123/81 (12/30 0425) Pulse Rate: 89 (12/30 0425)  Labs: Recent Labs    11/04/18 1409 11/04/18 2221 11/05/18 0001 11/05/18 0437  HGB 14.1  --   --  13.3  HCT 44.4  --   --  41.2  PLT 217  --   --  226  APTT  --  28  --   --   LABPROT  --  14.0  --   --   INR  --  1.09  --   --   HEPARINUNFRC  --   --   --  0.50  CREATININE 1.30*  --   --   --   TROPONINI  --   --  <0.03  --     Estimated Creatinine Clearance: 96.1 mL/min (A) (by C-G formula based on SCr of 1.3 mg/dL (H)).   Medical History: Past Medical History:  Diagnosis Date  . Marijuana use     Medications:  Scheduled:  . sodium chloride (PF)      . sodium chloride (PF)      . sodium chloride flush  3 mL Intravenous Q12H   Infusions:  . heparin 1,500 Units/hr (11/04/18 2304)    Assessment: 37 yoM c/o left CP that radiates to his neck.  CT + LLL PE with right heart strain.  IV heparin for PE.  Baseline labs WNL Today, 12/30 0437 HL= 0.50 at goal, drawn at ~ 5.5 hrs vs 6 hrs, no bleeding per RN, infusion was paused for < 2 mins per RN.   Goal of Therapy:  Heparin level 0.3-0.7 units/ml Monitor platelets by anticoagulation protocol: Yes   Plan:   Continue heparin drip at 1500 units/hr Recheck HL today to ensure stays in range as bolus wears off Daily CBC/HL   Susanne GreenhouseGreen, Ninoska Goswick R 11/05/2018,5:15 AM

## 2018-11-06 ENCOUNTER — Inpatient Hospital Stay (HOSPITAL_COMMUNITY): Payer: Self-pay

## 2018-11-06 LAB — BASIC METABOLIC PANEL
Anion gap: 8 (ref 5–15)
BUN: 15 mg/dL (ref 6–20)
CO2: 28 mmol/L (ref 22–32)
CREATININE: 1.38 mg/dL — AB (ref 0.61–1.24)
Calcium: 9 mg/dL (ref 8.9–10.3)
Chloride: 100 mmol/L (ref 98–111)
GFR calc Af Amer: >60 mL/min (ref >60)
GFR calc non Af Amer: >60 mL/min (ref >60)
Glucose, Bld: 110 mg/dL — ABNORMAL HIGH (ref 70–99)
Potassium: 4.5 mmol/L (ref 3.5–5.1)
Sodium: 136 mmol/L (ref 135–145)

## 2018-11-06 LAB — CBC
HEMATOCRIT: 40.2 % (ref 39.0–52.0)
Hemoglobin: 12.9 g/dL — ABNORMAL LOW (ref 13.0–17.0)
MCH: 30.9 pg (ref 26.0–34.0)
MCHC: 32.1 g/dL (ref 30.0–36.0)
MCV: 96.4 fL (ref 80.0–100.0)
Platelets: 187 10*3/uL (ref 150–400)
RBC: 4.17 MIL/uL — ABNORMAL LOW (ref 4.22–5.81)
RDW: 12.7 % (ref 11.5–15.5)
WBC: 9.1 10*3/uL (ref 4.0–10.5)
nRBC: 0 % (ref 0.0–0.2)

## 2018-11-06 LAB — HEPARIN LEVEL (UNFRACTIONATED)
Heparin Unfractionated: 0.36 IU/mL (ref 0.30–0.70)
Heparin Unfractionated: 0.3 IU/mL (ref 0.30–0.70)
Heparin Unfractionated: 0.41 IU/mL (ref 0.30–0.70)

## 2018-11-06 MED ORDER — HEPARIN (PORCINE) 25000 UT/250ML-% IV SOLN
1600.0000 [IU]/h | INTRAVENOUS | Status: DC
  Administered 2018-11-07: 1600 [IU]/h via INTRAVENOUS
  Filled 2018-11-06: qty 250

## 2018-11-06 NOTE — Progress Notes (Signed)
Pt up to bathroom this am to complete AM care. Pt weak and shortness of breathe with guarding of left chest. Pt encouraged to use IS--goal of 1000 ml, 250 ml completed. Pt coughed up a small amount of bloody sputum. MD aware will cont to monitor. SRP, RN

## 2018-11-06 NOTE — Progress Notes (Signed)
PROGRESS NOTE   Miguel Franklin  ZOX:[id number]    DOB: [birth year]    DOA: 11/04/2018  PCP: Patient, No Pcp Per   I have briefly reviewed patients previous medical records in [facility].  Brief Narrative:  37 year old male, works as a landscaper,PMH of HTN not on antihypertensives, tobacco and THC use, presented to [facility] ED on 11/04/2018 due to sudden onset of left-sided pleuritic type of chest pain, cough with an episode of minimal hemoptysis, some dyspnea.  Recent travels (short distance flights, car rides up to 3-1/2 hours and a cruise).  Admitted for submassive pulmonary embolism with right heart strain, left lower lobe infarct.  Initiated on IV heparin infusion.  Slowly improving but still with significant left lower pleuritic chest pain.   Assessment & Plan:   Principal Problem:   Pulmonary embolism on left [facility]) Active Problems:   Lung nodule seen on imaging study   1. 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 No family history of VTE, verified today.  Recent travels as indicated above.  As per admitting physician, admitted recently taking a male sexual enhancement pill (?  Extenze) which he ordered online.  Hemodynamically stable and not hypoxic.  Initiated on IV heparin infusion.  TTE: LVEF 60-65%.  Right ventricle cavity size was normal with normal systolic function.  Troponin cycled and negative. Continue IV heparin infusion overnight to monitor closely if he has recurrent hemoptysis.  I discussed in detail with patient and his fiance at bedside regarding options for oral anticoagulation including warfarin with Lovenox bridge, Eliquis/Xarelto & Pradaxa, risks and benefits of each.  They are leaning towards NOAC's and are yet to finalize. LEV Dopplers negative for DVT.  Duration of anticoagulation: At least 3 months.  Patient has ongoing significant left-sided pleuritic chest pain requiring opioids.  Had  minimal hemoptysis today.  Remains on IV heparin.  He is yet to decide on oral anticoagulation.  I discussed in detail with the pulmonologist on call 12/31: Patient likely did not have submassive PE given normal echo, recommends 3 months of anticoagulation, outpatient hematology consultation since etiology not certain, will need repeat CT chest without contrast in about 6 weeks to ensure area of pulmonary infarct has resolved and there is nothing abnormal in that area.  Follow-up chest x-ray in a.m. 2. Left lower lobe pulmonary nodule: Outpatient follow-up as per radiology recommendations. 3. Tobacco and THC use: Cessation counseled. 4. Stage II chronic kidney disease: Creatinine 1.3 and stable.  Baseline creatinine not known.  Despite IV fluid hydration, no change in creatinine.?  Renal ultrasound to evaluate. 5. Essential hypertension: Not on medications at home.  Mild intermittent BP elevations, likely related to pain.  For now observe.  If persistently elevated, consider antihypertensives.   DVT prophylaxis: Currently on IV heparin infusion Code Status: Full Family Communication: Discussed in detail with patient's fianc at bedside.  She indicated that they were going to decide on oral anticoagulation by later today and were leaning towards Eliquis. Disposition: Home pending clinical improvement.   Consultants:  None  Procedures:  None  Antimicrobials:  None   Subjective: Still has significant left-sided pleuritic chest pain but improved with adjustment of medications.  States that it is better when he is breathing shallow and not coughing.  With cough it can increase to 10/10 in severity.  Coughed up 2 small clots this morning.  Trying to ambulate to the bathroom.  ROS: As above, otherwise  negative.  Objective:  Vitals:   11/05/18 2304 11/06/18 0112 11/06/18 0330 11/06/18 1615  BP:   (!) 136/96 (!) 137/92  Pulse:   91 92  Resp:   20 16  Temp: 99.7 F (37.6 C) 99.4 F (37.4  C) 98.9 F (37.2 C) 98.1 F (36.7 C)  TempSrc: Oral Oral Oral Oral  SpO2:   93% 93%  Weight:      Height:        Examination:  General exam: Does not young male, well built and nourished, seen ambulating slowly and uncomfortably to the bathroom. Respiratory system: Slightly diminished breath sounds in left base with occasional crackles.  Rest of lung fields clear to auscultation.  Poor inspiratory effort.  No pleural rub.  No increased work of breathing. Cardiovascular system: S1 & S2 heard, RRR. No JVD, murmurs, rubs, gallops or clicks. No pedal edema.  Telemetry personally reviewed: Sinus rhythm.  Stable Gastrointestinal system: Abdomen is nondistended, soft and nontender. No organomegaly or masses felt. Normal bowel sounds heard.  Stable Central nervous system: Alert and oriented. No focal neurological deficits. Extremities: Symmetric 5 x 5 power.  Left leg?  Appears slightly asymmetrically larger than right without any other acute findings but patient states that this is chronic for him. Skin: No rashes, lesions or ulcers Psychiatry: Judgement and insight appear normal. Mood & affect appropriate.     Data Reviewed: I have personally reviewed following labs and imaging studies  CBC: Recent Labs  Lab 11/04/18 1409 11/05/18 0437 11/06/18 0522  WBC 9.9 11.8* 9.1  HGB 14.1 13.3 12.9*  HCT 44.4 41.2 40.2  MCV 95.5 93.4 96.4  PLT 217 226 187   Basic Metabolic Panel: Recent Labs  Lab 11/04/18 1409 11/05/18 0437 11/06/18 0522  NA 139 135 136  K 4.1 4.4 4.5  CL 103 98 100  CO2 28 27 28   GLUCOSE 102* 124* 110*  BUN 14 14 15   CREATININE 1.30* 1.37* 1.38*  CALCIUM 9.6 9.5 9.0   Liver Function Tests: No results for input(s): AST, ALT, ALKPHOS, BILITOT, PROT, ALBUMIN in the last 168 hours. Coagulation Profile: Recent Labs  Lab 11/04/18 2221  INR 1.09   Cardiac Enzymes: Recent Labs  Lab 11/05/18 0001 11/05/18 0437 11/05/18 1121  TROPONINI <0.03 <0.03 <0.03     Radiology Studies: Ct Chest W Contrast  Result Date: 11/04/2018 CLINICAL DATA:  37 year old with acute chest pain and shortness of breath. Abnormal chest x-ray earlier same day questioning LEFT basilar pleural thickening or LEFT pleural effusion. EXAM: CT CHEST WITH CONTRAST TECHNIQUE: Multidetector CT imaging of the chest was performed during intravenous contrast administration. CONTRAST:  ISOVUE-300 IOPAMIDOL INJECTION 61% IV. COMPARISON:  No prior CT. Chest x-ray earlier same day is correlated. FINDINGS: Cardiovascular: Heart size upper normal. The examination was not performed for angiographic evaluation of the pulmonary arteries, but there is a question of a filling defect in the LEFT LOWER LOBE pulmonary artery and several of its branches. No visible coronary atherosclerosis. No pericardial effusion. No visible atherosclerosis involving the thoracic or proximal abdominal aorta or their branches. Mediastinum/Nodes: No pathologically enlarged mediastinal, hilar or axillary lymph nodes. No mediastinal masses. Normal-appearing esophagus. Normal-appearing thyroid gland. Lungs/Pleura: Airspace consolidation involving the POSTERIOR segment of the LEFT LOWER LOBE peripherally with an associated very small LEFT pleural effusion, accounting for the chest x-ray findings. No airspace consolidation elsewhere in either lung. Approximate 6 x 5 mm (6 mm mean) nodule involving the SUPERIOR segment LEFT LOWER LOBE (series 5,  image 82). No parenchymal nodules elsewhere in either lung. Minimal scarring involving the lingula and RIGHT MIDDLE LOBE. Central airways patent without significant bronchial wall thickening. Upper Abdomen: Visualized upper abdomen unremarkable. Musculoskeletal: Regional skeleton unremarkable without acute or significant osseous abnormality. IMPRESSION: 1. While not done as an angiographic evaluation of the pulmonary arteries, there is a possible pulmonary embolus to the LEFT LOWER LOBE  pulmonary artery and its branches. 2. Airspace consolidation involving the POSTERIOR segment of the LEFT LOWER LOBE peripherally with an associated very small LEFT pleural effusion, accounting for the chest x-ray findings. This could represent pulmonary infarct or pneumonia. 3. Approximate 6 mm nodule involving the SUPERIOR segment LEFT LOWER LOBE. Non-contrast chest CT at 6-12 months is recommended. If the nodule is stable at time of repeat CT, then future CT at 18-24 months (from today's scan) is considered optional for low-risk patients, but is recommended for high-risk patients. This recommendation follows the consensus statement: Guidelines for Management of Incidental Pulmonary Nodules Detected on CT Images: From the Fleischner Society 2017; Radiology 2017; 284:228-243. Results were telephoned to [facility], [place], at the time of interpretation on 11/04/2018 at 7:53 p.m. Electronically Signed   By: Hulan Saas M.D.   On: 11/04/2018 19:55   Ct Angio Chest Pe W/cm &/or Wo Cm  Result Date: 11/04/2018 CLINICAL DATA:  Acute onset of angina and dyspnea. EXAM: CT ANGIOGRAPHY CHEST WITH CONTRAST TECHNIQUE: Multidetector CT imaging of the chest was performed using the standard protocol during bolus administration of intravenous contrast. Multiplanar CT image reconstructions and MIPs were obtained to evaluate the vascular anatomy. CONTRAST:  ISOVUE-370 IOPAMIDOL (ISOVUE-370) INJECTION 76% COMPARISON:  CT of the chest performed earlier today at 7:16 p.m. FINDINGS: Cardiovascular: Pulmonary embolus is noted within the pulmonary artery to the left lower lobe, extending distally into subsegmental branches. The RV/LV ratio of 0.94 corresponds to right heart strain and at least submassive pulmonary embolus. The heart is normal in size. The thoracic aorta is grossly unremarkable. The great vessels are within normal limits. Mediastinum/Nodes: The mediastinum is unremarkable in appearance. No mediastinal  lymphadenopathy is seen. No pericardial effusion is identified. The visualized portions of the thyroid gland are unremarkable. No axillary lymphadenopathy is seen. Lungs/Pleura: There is a pulmonary infarct at the left lower lobe, with trace associated pleural fluid. The previously noted nodule at the left lower lobe (image 77 of 152) is less well characterized on the current study, and measures approximately 5 mm. The right lung appears clear. No pneumothorax is seen. Upper Abdomen: The visualized portions of the liver and spleen are unremarkable. The visualized portions of the pancreas, adrenal glands and kidneys are grossly unremarkable. Musculoskeletal: No acute osseous abnormalities are identified. The visualized musculature is unremarkable in appearance. Review of the MIP images confirms the above findings. IMPRESSION: 1. Pulmonary embolus within the pulmonary artery to the left lower lobe, extending distally into subsegmental branches. CT evidence of right heart strain (RV/LV Ratio = 0.94) consistent with at least submassive (intermediate risk) PE. The presence of right heart strain has been associated with an increased risk of morbidity and mortality. Please activate Code PE by paging [phone]. 2. Pulmonary infarct at the left lower lobe, with trace associated pleural fluid. 3. Previously noted nodule at the left lower lobe is less well characterized on the current study, and measures approximately 5 mm. Follow-up recommendation was provided on the recent prior CT. Critical Value/emergent results were called by telephone at the time of interpretation on 11/04/2018 at 9:52  pm to ANA HERNANDEZ PA, who verbally acknowledged these results. Electronically Signed   By: Roanna RaiderJeffery  Chang M.D.   On: 11/04/2018 21:55   Dg Chest Port 1 View  Result Date: 11/06/2018 CLINICAL DATA:  Acute onset of shortness of breath and left-sided chest pain. EXAM: PORTABLE CHEST 1 VIEW COMPARISON:  Chest radiograph and CTA of  the chest performed 11/04/2018 FINDINGS: A small left pleural effusion is noted. Hazy left basilar airspace opacity likely reflects mildly worsened pulmonary infarct, as on the prior CTA. No pneumothorax is seen. The cardiomediastinal silhouette is borderline enlarged. No acute osseous abnormalities are identified. IMPRESSION: Small left pleural effusion noted. Hazy left basilar airspace opacity likely reflects mildly worsened pulmonary infarct, as on the prior CTA. Borderline cardiomegaly. Electronically Signed   By: Roanna RaiderJeffery  Chang M.D.   On: 11/06/2018 04:05   Vas [place] Lower Extremity Venous (dvt)  Result Date: 11/05/2018  Lower Venous Study Indications: Pulmonary embolism.  Risk Factors: Confirmed PE. Performing Technologist: Toma DeitersVirginia Slaughter RVS  Examination Guidelines: A complete evaluation includes B-mode imaging, spectral Doppler, color Doppler, and power Doppler as needed of all accessible portions of each vessel. Bilateral testing is considered an integral part of a complete examination. Limited examinations for reoccurring indications may be performed as noted.  Right Venous Findings: +---------+---------------+---------+-----------+----------+-------+          CompressibilityPhasicitySpontaneityPropertiesSummary +---------+---------------+---------+-----------+----------+-------+ CFV      Full           Yes      Yes                          +---------+---------------+---------+-----------+----------+-------+ SFJ      Full                                                 +---------+---------------+---------+-----------+----------+-------+ FV Prox  Full           Yes      Yes                          +---------+---------------+---------+-----------+----------+-------+ FV Mid   Full                                                 +---------+---------------+---------+-----------+----------+-------+ FV DistalFull           Yes      Yes                           +---------+---------------+---------+-----------+----------+-------+ PFV      Full           Yes      Yes                          +---------+---------------+---------+-----------+----------+-------+ POP      Full           Yes      Yes                          +---------+---------------+---------+-----------+----------+-------+ PTV      Full                                                 +---------+---------------+---------+-----------+----------+-------+  PERO     Full                                                 +---------+---------------+---------+-----------+----------+-------+  Left Venous Findings: +---------+---------------+---------+-----------+----------+-------+          CompressibilityPhasicitySpontaneityPropertiesSummary +---------+---------------+---------+-----------+----------+-------+ CFV      Full           Yes      Yes                          +---------+---------------+---------+-----------+----------+-------+ SFJ      Full                                                 +---------+---------------+---------+-----------+----------+-------+ FV Prox  Full           Yes      Yes                          +---------+---------------+---------+-----------+----------+-------+ FV Mid   Full                                                 +---------+---------------+---------+-----------+----------+-------+ FV DistalFull           Yes      Yes                          +---------+---------------+---------+-----------+----------+-------+ PFV      Full           Yes      Yes                          +---------+---------------+---------+-----------+----------+-------+ POP      Full           Yes      Yes                          +---------+---------------+---------+-----------+----------+-------+ PTV      Full                                                  +---------+---------------+---------+-----------+----------+-------+ PERO     Full                                                 +---------+---------------+---------+-----------+----------+-------+    Summary: Right: There is no evidence of deep vein thrombosis in the lower extremity. No cystic structure found in the popliteal fossa. Left: There is no evidence of deep vein thrombosis in the lower extremity. No cystic structure found in the popliteal fossa.  *See table(s) above for measurements and observations. Electronically signed by Waverly Ferrari MD on 11/05/2018 at 5:10:26 PM.  Final         Scheduled Meds: . sodium chloride flush  3 mL Intravenous Q12H   Continuous Infusions: . heparin 1,600 Units/hr (11/06/18 1538)     LOS: 2 days     Marcellus ScottAnand Airrion Otting, MD, FACP, [facility]. [facility] Pager [phone]  If 7PM-7AM, please contact night-coverage [email or web address] Password [email or web address] 11/06/2018, 6:04 PM

## 2018-11-06 NOTE — Progress Notes (Addendum)
ANTICOAGULATION CONSULT NOTE -  Consult  Pharmacy Consult for IV heparin Indication: pulmonary embolus  No Known Allergies  Patient Measurements: Height: 6\' 5"  (195.6 cm) Weight: 210 lb 12.2 oz (95.6 kg) IBW/kg (Calculated) : 89.1 Heparin Dosing Weight = TBW = 95 kg  Vital Signs: Temp: 98.9 F (37.2 C) (12/31 0330) Temp Source: Oral (12/31 0330) BP: 136/96 (12/31 0330) Pulse Rate: 91 (12/31 0330)  Labs: Recent Labs    11/04/18 1409 11/04/18 2221 11/05/18 0001 11/05/18 0437 11/05/18 1121 11/06/18 0522  HGB 14.1  --   --  13.3  --  12.9*  HCT 44.4  --   --  41.2  --  40.2  PLT 217  --   --  226  --  187  APTT  --  28  --   --   --   --   LABPROT  --  14.0  --   --   --   --   INR  --  1.09  --   --   --   --   HEPARINUNFRC  --   --   --  0.50 0.53 0.36  CREATININE 1.30*  --   --  1.37*  --  1.38*  TROPONINI  --   --  <0.03 <0.03 <0.03  --     Estimated Creatinine Clearance: 90.6 mL/min (A) (by C-G formula based on SCr of 1.38 mg/dL (H)).   Medical History: Past Medical History:  Diagnosis Date  . Marijuana use     Medications:  Scheduled:  . sodium chloride flush  3 mL Intravenous Q12H   Infusions:  . sodium chloride 75 mL/hr at 11/06/18 0309  . heparin 1,500 Units/hr (11/06/18 0200)    Assessment: 37 yoM c/o left CP that radiates to his neck.   CT + for LLL PE with right heart strain.  IV heparin for PE.  Baseline labs WNL  Today, 12/30  Hgb 12.9 - slightly decreased from yesterday  Plt - stable, WNL  HL = 0.36 is therapeutic on heparin infusion of 1500 units/hr  Per discussion with RN - no issues with heparin infusion. No signs/symptoms of bleeding  Goal of Therapy:  Heparin level 0.3-0.7 units/ml Monitor platelets by anticoagulation protocol: Yes   Plan:   Continue heparin drip at 1500 units/hr  Will recheck HL this afternoon to ensure it remains within therapeutic range  Daily CBC and HL per protocol  Monitor for signs/symptoms of  bleeding or thrombosis  Follow for conversion to oral anticoagulation   Cindi CarbonMary M Haeleigh Streiff, PharmD 11/06/2018,7:33 AM   Addendum:  Assessment: -Afternoon HL = 0.30 is therapeutic but on the lowest end of therapeutic range on 1500 units/hr. HL trending down. -Per discussion with RN: no interruptions in heparin infusion. Pt coughed up a small amount of bloody sputum, but MD aware.   Plan: -Will slightly increase heparin drip to 1600 units/hr -Check 6 hour HL -CBC and HL daily -Continue to monitor for signs/symptoms of bleeding or thrombosis  Cindi CarbonMary M Cereniti Curb, PharmD Clinical Pharmacist 11/06/18 2:53 PM

## 2018-11-06 NOTE — Progress Notes (Signed)
Pt pain has improved 5/10 after pain med, able to increase activity and stand with decrease pain, increased volume on the on IS 750 ml, continue to encourage IS usage. Fiance' at bedside with pt. Will cont to monitor. SRP RN

## 2018-11-06 NOTE — Progress Notes (Signed)
ANTICOAGULATION CONSULT NOTE -  Consult  Pharmacy Consult for IV heparin Indication: pulmonary embolus  No Known Allergies  Patient Measurements: Height: 6\' 5"  (195.6 cm) Weight: 210 lb 12.2 oz (95.6 kg) IBW/kg (Calculated) : 89.1 Heparin Dosing Weight = TBW = 95 kg  Vital Signs: Temp: 98.1 F (36.7 C) (12/31 1615) Temp Source: Oral (12/31 1615) BP: 137/92 (12/31 1615) Pulse Rate: 92 (12/31 1615)  Labs: Recent Labs    11/04/18 1409 11/04/18 2221 11/05/18 0001  11/05/18 0437 11/05/18 1121 11/06/18 0522 11/06/18 1237 11/06/18 2105  HGB 14.1  --   --   --  13.3  --  12.9*  --   --   HCT 44.4  --   --   --  41.2  --  40.2  --   --   PLT 217  --   --   --  226  --  187  --   --   APTT  --  28  --   --   --   --   --   --   --   LABPROT  --  14.0  --   --   --   --   --   --   --   INR  --  1.09  --   --   --   --   --   --   --   HEPARINUNFRC  --   --   --    < > 0.50 0.53 0.36 0.30 0.41  CREATININE 1.30*  --   --   --  1.37*  --  1.38*  --   --   TROPONINI  --   --  <0.03  --  <0.03 <0.03  --   --   --    < > = values in this interval not displayed.    Estimated Creatinine Clearance: 90.6 mL/min (A) (by C-G formula based on SCr of 1.38 mg/dL (H)).   Medical History: Past Medical History:  Diagnosis Date  . Marijuana use     Medications:  Scheduled:  . sodium chloride flush  3 mL Intravenous Q12H   Infusions:  . heparin 1,600 Units/hr (11/06/18 1538)    Assessment: 37 yoM c/o left CP that radiates to his neck.   CT + for LLL PE with right heart strain.  IV heparin for PE.  Baseline labs WNL  Today, 12/30  Heparin level therapeutic on 1600 units/hr  MD aware of small amount of hemoptysis and is monitoring  Goal of Therapy:  Heparin level 0.3-0.7 units/ml Monitor platelets by anticoagulation protocol: Yes   Plan:   Continue heparin drip at 1600 units/hr  Daily CBC and HL per protocol  Monitor for signs/symptoms of bleeding or  thrombosis  Follow for conversion to oral anticoagulation   Loralee PacasErin Darcey Cardy, PharmD, BCPS Pager: [phone] 11/06/2018,9:38 PM

## 2018-11-07 ENCOUNTER — Inpatient Hospital Stay (HOSPITAL_COMMUNITY): Payer: Self-pay

## 2018-11-07 DIAGNOSIS — N182 Chronic kidney disease, stage 2 (mild): Secondary | ICD-10-CM

## 2018-11-07 DIAGNOSIS — R042 Hemoptysis: Secondary | ICD-10-CM

## 2018-11-07 LAB — BASIC METABOLIC PANEL
Anion gap: 9 (ref 5–15)
BUN: 13 mg/dL (ref 6–20)
CO2: 27 mmol/L (ref 22–32)
Calcium: 9 mg/dL (ref 8.9–10.3)
Chloride: 101 mmol/L (ref 98–111)
Creatinine, Ser: 1.4 mg/dL — ABNORMAL HIGH (ref 0.61–1.24)
GFR calc non Af Amer: >60 mL/min (ref >60)
Glucose, Bld: 112 mg/dL — ABNORMAL HIGH (ref 70–99)
Potassium: 4.4 mmol/L (ref 3.5–5.1)
Sodium: 137 mmol/L (ref 135–145)

## 2018-11-07 LAB — CBC
HEMATOCRIT: 38.4 % — AB (ref 39.0–52.0)
Hemoglobin: 12.3 g/dL — ABNORMAL LOW (ref 13.0–17.0)
MCH: 30.7 pg (ref 26.0–34.0)
MCHC: 32 g/dL (ref 30.0–36.0)
MCV: 95.8 fL (ref 80.0–100.0)
Platelets: 207 10*3/uL (ref 150–400)
RBC: 4.01 MIL/uL — ABNORMAL LOW (ref 4.22–5.81)
RDW: 12.4 % (ref 11.5–15.5)
WBC: 7.3 10*3/uL (ref 4.0–10.5)
nRBC: 0 % (ref 0.0–0.2)

## 2018-11-07 LAB — HEPARIN LEVEL (UNFRACTIONATED): Heparin Unfractionated: 0.32 IU/mL (ref 0.30–0.70)

## 2018-11-07 MED ORDER — RIVAROXABAN 15 MG PO TABS
15.0000 mg | ORAL_TABLET | Freq: Once | ORAL | Status: AC
  Administered 2018-11-07: 15 mg via ORAL
  Filled 2018-11-07: qty 1

## 2018-11-07 MED ORDER — RIVAROXABAN (XARELTO) EDUCATION KIT FOR DVT/PE PATIENTS
PACK | Freq: Once | Status: AC
  Administered 2018-11-07: 12:00:00
  Filled 2018-11-07: qty 1

## 2018-11-07 MED ORDER — RIVAROXABAN (XARELTO) VTE STARTER PACK (15 & 20 MG): ORAL_TABLET | ORAL | 0 refills | Status: AC

## 2018-11-07 MED ORDER — HEPARIN (PORCINE) 25000 UT/250ML-% IV SOLN: 1650.0000 [IU]/h | INTRAVENOUS | Status: AC

## 2018-11-07 MED ORDER — RIVAROXABAN 15 MG PO TABS: 15.0000 mg | ORAL_TABLET | Freq: Two times a day (BID) | ORAL | Status: DC

## 2018-11-07 MED ORDER — ACETAMINOPHEN 500 MG PO TABS: 1000.0000 mg | ORAL_TABLET | Freq: Four times a day (QID) | ORAL | Status: AC | PRN

## 2018-11-07 MED ORDER — RIVAROXABAN 20 MG PO TABS: 20.0000 mg | ORAL_TABLET | Freq: Every day | ORAL | Status: DC

## 2018-11-07 MED ORDER — OXYCODONE HCL 10 MG PO TABS: 10.0000 mg | ORAL_TABLET | Freq: Three times a day (TID) | ORAL | 0 refills | Status: AC | PRN

## 2018-11-07 NOTE — Progress Notes (Signed)
ANTICOAGULATION CONSULT NOTE -  Consult  Pharmacy Consult for IV heparin Indication: pulmonary embolus  No Known Allergies  Patient Measurements: Height: 6\' 5"  (195.6 cm) Weight: 210 lb 12.2 oz (95.6 kg) IBW/kg (Calculated) : 89.1 Heparin Dosing Weight = TBW = 95 kg  Vital Signs: Temp: 100.5 F (38.1 C) (01/01 0327) Temp Source: Oral (01/01 0327) BP: 136/82 (01/01 0327) Pulse Rate: 94 (01/01 0327)  Labs: Recent Labs    11/04/18 2221 11/05/18 0001  11/05/18 0437 11/05/18 1121 11/06/18 0522 11/06/18 1237 11/06/18 2105 11/07/18 0504  HGB  --   --   --  13.3  --  12.9*  --   --  12.3*  HCT  --   --   --  41.2  --  40.2  --   --  38.4*  PLT  --   --   --  226  --  187  --   --  207  APTT 28  --   --   --   --   --   --   --   --   LABPROT 14.0  --   --   --   --   --   --   --   --   INR 1.09  --   --   --   --   --   --   --   --   HEPARINUNFRC  --   --    < > 0.50 0.53 0.36 0.30 0.41 0.32  CREATININE  --   --   --  1.37*  --  1.38*  --   --  1.40*  TROPONINI  --  <0.03  --  <0.03 <0.03  --   --   --   --    < > = values in this interval not displayed.    Estimated Creatinine Clearance: 89.3 mL/min (A) (by C-G formula based on SCr of 1.4 mg/dL (H)).   Medical History: Past Medical History:  Diagnosis Date  . Marijuana use     Medications:  Scheduled:  . sodium chloride flush  3 mL Intravenous Q12H   Infusions:  . heparin      Assessment: 37 yoM c/o left CP that radiates to his neck.   CT + for LLL PE with right heart strain.  IV heparin for PE.  Baseline labs WNL  Significant Events: 12/31: Small amount of hemoptysis. MD aware and monitoring.  Today, 12/30  Heparin level (0.32) is on lower end of therapeutic range on heparin infusion of 1600 units/hr  Hgb 12.3 - slightly decreased  Plt WNL, stable  Confirmed with RN that heparin infusing at correct rate, no issues with/interruptions in heparin infusion, no signs/symptoms of bleeding  Goal of  Therapy:  Heparin level 0.3-0.7 units/ml Monitor platelets by anticoagulation protocol: Yes   Plan:   Slightly increase heparin drip to 1650 units/hr   Check 6 hour HL  Daily CBC and HL per protocol  Monitor for signs/symptoms of bleeding or thrombosis  Follow for conversion to oral anticoagulation   Cindi Carbon, PharmD 11/07/18 9:33 AM

## 2018-11-07 NOTE — Discharge Summary (Signed)
Physician Discharge Summary  Maurie BoettcherJoe Foye ACZ:[id number]:[id number] DOB: 1981-06-17  PCP: Patient, No Pcp Per  Admit date: 11/04/2018 Discharge date: 11/07/2018  Recommendations for Outpatient Follow-up:  1. [facility]: Case management was consulted who provided patient with resources to find a PCP in his hometown.  He has been advised to follow-up with PCP in 1 week with repeat labs (CBC & BMP). 2. Recommend outpatient Hematology consultation for further evaluation of possible hypercoagulable state.  3. Recommend outpatient Pulmonology consultation for further evaluation and follow-up of pulmonary infarction and pulmonary nodule.   4. Recommend repeating CT chest without contrast in approximately 6 weeks to ensure resolution of pulmonary infarct and rule out any underlying abnormalities.   5. All above were discussed in detail with patient and fianc multiple times and they verbalized understanding.  Home Health: None Equipment/Devices: None  Discharge Condition: Improved and stable CODE STATUS: Full Diet recommendation: Heart healthy diet.  Discharge Diagnoses:  Principal Problem:   Pulmonary embolism on left [facility](HCC) Active Problems:   Lung nodule seen on imaging study   Brief Summary: 38 year old male, works as a landscaper,PMH of HTN not on antihypertensives, tobacco and THC use, [facility] on 11/04/2018 due to sudden onset of left-sided pleuritic type of chest pain, cough with an episode of minimal hemoptysis, some dyspnea.  Recent travels (short distance flights, car rides up to 3-1/2 hours and a cruise trip).  Admitted for submassive pulmonary embolism with right heart strain, left lower lobe infarct.  Initiated on IV heparin infusion.   Assessment & Plan:   1. 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 No family history of VTE.  Recent travels as indicated above but not  sure if that alone was precipitating factor.  As per admitting physician, admitted recently taking a male sexual enhancement pill (?  Extenze) which he ordered online. He remained stable and not hypoxic.  Initiated on IV heparin infusion.  TTE: LVEF 60-65%.  Right ventricle cavity size was normal with normal systolic function.  Troponin cycled and negative. I discussed in detail with patient and his fiance regarding options for oral anticoagulation including warfarin with Lovenox bridge, Eliquis/Xarelto & Pradaxa, risks and benefits of each.  LEV Dopplers negative for DVT.  Duration of anticoagulation: At least 3 months.  He had recurrence of mild hemoptysis, expected due to pulmonary infarction.  Thereby he was continued an additional day on IV heparin for close monitoring.  He also had significant left-sided pleuritic chest pain requiring p.o. and IV opioids.  Both hemoptysis and his chest pain have significantly improved.  He has not used IV opioids in the last 24 hours and oral opioids since last night.  He has ambulated with RN and oxygen saturations did not drop below 95%.  He has finally opted to go with Xarelto.  IV heparin was stopped and he received first dose of Xarelto in the hospital. I discussed in detail with a Pulmonologist on 12/31: Patient likely did not have submassive PE given normal echo, recommends 3 months of anticoagulation, outpatient Hematology consultation for hypercoagulable work-up since etiology not certain, will need repeat CT chest without contrast in about 6 weeks to ensure area of pulmonary infarct has resolved and there is nothing abnormal in that area.  Chest x-ray 11/07/2018 personally reviewed.  Moderate left pleural effusion.  Patient had intermittent low-grade fevers, likely related to pulmonary infarction.  No productive cough otherwise and low clinical  suspicion for infectious etiology.  Case management was consulted and assisted patient with outpatient follow-up with PCP,  medication assistance.  He was provided a 30-day Xarelto discount card. 2. Left lower lobe pulmonary nodule: Outpatient follow-up as per radiology recommendations. 3. Tobacco and THC use: Cessation counseled. 4. Stage II chronic kidney disease:  Baseline creatinine not known.  Creatinine fluctuated and stable in the 1.3-1.4 range and did not change despite IV fluid hydration.  Renal ultrasound without acute findings.  Recommend outpatient follow-up with repeat BMP. 5. Essential hypertension: Not on medications at home.  Mild intermittent BP elevations, likely related to pain.  For now observe.  If persistently elevated, consider antihypertensives.  Consultants:  None  Procedures:  TEE 11/05/2018: Study Conclusions  - Left ventricle: The cavity size was normal. Systolic function was   normal. The estimated ejection fraction was in the range of 60%   to 65%. Wall motion was normal; there were no regional wall   motion abnormalities. There was no evidence of elevated   ventricular filling pressure by Doppler parameters. - Right ventricle: The cavity size was normal. Wall thickness was   normal. Systolic function was normal.   Discharge Instructions  Discharge Instructions    Call MD for:   Complete by:  As directed    Worsening coughing up of blood.   Call MD for:  difficulty breathing, headache or visual disturbances   Complete by:  As directed    Call MD for:  extreme fatigue   Complete by:  As directed    Call MD for:  persistant dizziness or light-headedness   Complete by:  As directed    Call MD for:  persistant nausea and vomiting   Complete by:  As directed    Call MD for:  severe uncontrolled pain   Complete by:  As directed    Call MD for:  temperature >100.4   Complete by:  As directed    Diet - low sodium heart healthy   Complete by:  As directed    Increase activity slowly   Complete by:  As directed        Medication List    TAKE these medications    acetaminophen 500 MG tablet Commonly known as:  TYLENOL Take 2 tablets (1,000 mg total) by mouth every 6 (six) hours as needed for mild pain, moderate pain or fever. What changed:  reasons to take this   Oxycodone HCl 10 MG Tabs Take 1 tablet (10 mg total) by mouth every 8 (eight) hours as needed for severe pain.   Rivaroxaban 15 & 20 MG Tbpk Take as directed on package: Start with one 15mg  tablet by mouth twice a day with food. On Day 22, switch to one 20mg  tablet once a day with food. PHARMACY: Please discard first tablet from pack which he has received in the hospital.      Follow-up Information    [facility]   Why:  You have been given resources by Hospital Case manager to get a Family Physician in your hometown. To be seen in 5 to 7 days with repeat labs (CBC & BMP).  Also recommend outpatient Hematology(blood specialist) & pulmonology(lung specialist) consultation         No Known Allergies    Procedures/Studies: Dg Chest 2 View  Result Date: 11/07/2018 CLINICAL DATA:  History of pulmonary infarct.  Cough EXAM: CHEST - 2 VIEW COMPARISON:  Chest radiograph 11/06/2018 FINDINGS: Monitoring leads overlie  the patient. Stable cardiac and mediastinal contours. Interval increase in size of moderate left pleural effusion with underlying consolidation. Right basilar atelectasis. No pneumothorax. IMPRESSION: Interval increase in size of moderate left pleural effusion with underlying consolidation. Electronically Signed   By: Annia Belt M.D.   On: 11/07/2018 10:26   Dg Chest 2 View  Result Date: 11/04/2018 CLINICAL DATA:  Pt is c/o LLQ chest pain onset last night. Denies any chest history. Smoker. EXAM: CHEST - 2 VIEW COMPARISON:  None. FINDINGS: Heart size is normal. There is pleural thickening/pleural effusion at the LEFT lung base, associated minimal LEFT LOWER lobe atelectasis. The RIGHT lung is clear. No pulmonary edema. IMPRESSION: Pleural  thickening/pleural effusion at the LEFT lung base. Electronically Signed   By: Norva Pavlov M.D.   On: 11/04/2018 14:16   Ct Chest W Contrast  Result Date: 11/04/2018 CLINICAL DATA:  38 year old with acute chest pain and shortness of breath. Abnormal chest x-ray earlier same day questioning LEFT basilar pleural thickening or LEFT pleural effusion. EXAM: CT CHEST WITH CONTRAST TECHNIQUE: Multidetector CT imaging of the chest was performed during intravenous contrast administration. CONTRAST:  ISOVUE-300 IOPAMIDOL INJECTION 61% IV. COMPARISON:  No prior CT. Chest x-ray earlier same day is correlated. FINDINGS: Cardiovascular: Heart size upper normal. The examination was not performed for angiographic evaluation of the pulmonary arteries, but there is a question of a filling defect in the LEFT LOWER LOBE pulmonary artery and several of its branches. No visible coronary atherosclerosis. No pericardial effusion. No visible atherosclerosis involving the thoracic or proximal abdominal aorta or their branches. Mediastinum/Nodes: No pathologically enlarged mediastinal, hilar or axillary lymph nodes. No mediastinal masses. Normal-appearing esophagus. Normal-appearing thyroid gland. Lungs/Pleura: Airspace consolidation involving the POSTERIOR segment of the LEFT LOWER LOBE peripherally with an associated very small LEFT pleural effusion, accounting for the chest x-ray findings. No airspace consolidation elsewhere in either lung. Approximate 6 x 5 mm (6 mm mean) nodule involving the SUPERIOR segment LEFT LOWER LOBE (series 5, image 82). No parenchymal nodules elsewhere in either lung. Minimal scarring involving the lingula and RIGHT MIDDLE LOBE. Central airways patent without significant bronchial wall thickening. Upper Abdomen: Visualized upper abdomen unremarkable. Musculoskeletal: Regional skeleton unremarkable without acute or significant osseous abnormality. IMPRESSION: 1. While not done as an angiographic  evaluation of the pulmonary arteries, there is a possible pulmonary embolus to the LEFT LOWER LOBE pulmonary artery and its branches. 2. Airspace consolidation involving the POSTERIOR segment of the LEFT LOWER LOBE peripherally with an associated very small LEFT pleural effusion, accounting for the chest x-ray findings. This could represent pulmonary infarct or pneumonia. 3. Approximate 6 mm nodule involving the SUPERIOR segment LEFT LOWER LOBE. Non-contrast chest CT at 6-12 months is recommended. If the nodule is stable at time of repeat CT, then future CT at 18-24 months (from today's scan) is considered optional for low-risk patients, but is recommended for high-risk patients. This recommendation follows the consensus statement: Guidelines for Management of Incidental Pulmonary Nodules Detected on CT Images: From the Fleischner Society 2017; Radiology 2017; 284:228-243. Results were telephoned to [facility], [place], at the time of interpretation on 11/04/2018 at 7:53 p.m. Electronically Signed   By: Hulan Saas M.D.   On: 11/04/2018 19:55   Ct Angio Chest Pe W/cm &/or Wo Cm  Result Date: 11/04/2018 CLINICAL DATA:  Acute onset of angina and dyspnea. EXAM: CT ANGIOGRAPHY CHEST WITH CONTRAST TECHNIQUE: Multidetector CT imaging of the chest was performed using the standard protocol during bolus  administration of intravenous contrast. Multiplanar CT image reconstructions and MIPs were obtained to evaluate the vascular anatomy. CONTRAST:  ISOVUE-370 IOPAMIDOL (ISOVUE-370) INJECTION 76% COMPARISON:  CT of the chest performed earlier today at 7:16 p.m. FINDINGS: Cardiovascular: Pulmonary embolus is noted within the pulmonary artery to the left lower lobe, extending distally into subsegmental branches. The RV/LV ratio of 0.94 corresponds to right heart strain and at least submassive pulmonary embolus. The heart is normal in size. The thoracic aorta is grossly unremarkable. The great vessels are within  normal limits. Mediastinum/Nodes: The mediastinum is unremarkable in appearance. No mediastinal lymphadenopathy is seen. No pericardial effusion is identified. The visualized portions of the thyroid gland are unremarkable. No axillary lymphadenopathy is seen. Lungs/Pleura: There is a pulmonary infarct at the left lower lobe, with trace associated pleural fluid. The previously noted nodule at the left lower lobe (image 77 of 152) is less well characterized on the current study, and measures approximately 5 mm. The right lung appears clear. No pneumothorax is seen. Upper Abdomen: The visualized portions of the liver and spleen are unremarkable. The visualized portions of the pancreas, adrenal glands and kidneys are grossly unremarkable. Musculoskeletal: No acute osseous abnormalities are identified. The visualized musculature is unremarkable in appearance. Review of the MIP images confirms the above findings. IMPRESSION: 1. Pulmonary embolus within the pulmonary artery to the left lower lobe, extending distally into subsegmental branches. CT evidence of right heart strain (RV/LV Ratio = 0.94) consistent with at least submassive (intermediate risk) PE. The presence of right heart strain has been associated with an increased risk of morbidity and mortality. Please activate Code PE by paging [phone]. 2. Pulmonary infarct at the left lower lobe, with trace associated pleural fluid. 3. Previously noted nodule at the left lower lobe is less well characterized on the current study, and measures approximately 5 mm. Follow-up recommendation was provided on the recent prior CT. Critical Value/emergent results were called by telephone at the time of interpretation on 11/04/2018 at 9:52 pm to [facility] PA, who verbally acknowledged these results. Electronically Signed   By: Roanna Raider M.D.   On: 11/04/2018 21:55   US Renal  Result Date: 11/07/2018 CLINICAL DATA:  Chronic kidney disease. EXAM: RENAL / URINARY TRACT  ULTRASOUND COMPLETE COMPARISON:  None. FINDINGS: Right Kidney: Renal measurements: 10.6 x 4.4 x 5.1 cm = volume: 124 mL . Echogenicity within normal limits. No mass or hydronephrosis visualized. Left Kidney: Renal measurements: 9.7 x 6.1 x 4.3 cm = volume: 131 mL. Echogenicity within normal limits. No mass or hydronephrosis visualized. Bladder: Appears normal for degree of bladder distention. IMPRESSION: Normal renal ultrasound. Electronically Signed   By: Richarda Overlie M.D.   On: 11/07/2018 09:40   Dg Chest Port 1 View  Result Date: 11/06/2018 CLINICAL DATA:  Acute onset of shortness of breath and left-sided chest pain. EXAM: PORTABLE CHEST 1 VIEW COMPARISON:  Chest radiograph and CTA of the chest performed 11/04/2018 FINDINGS: A small left pleural effusion is noted. Hazy left basilar airspace opacity likely reflects mildly worsened pulmonary infarct, as on the prior CTA. No pneumothorax is seen. The cardiomediastinal silhouette is borderline enlarged. No acute osseous abnormalities are identified. IMPRESSION: Small left pleural effusion noted. Hazy left basilar airspace opacity likely reflects mildly worsened pulmonary infarct, as on the prior CTA. Borderline cardiomegaly. Electronically Signed   By: Roanna Raider M.D.   On: 11/06/2018 04:05   Vas [place] Lower Extremity Venous (dvt)  Result Date: 11/05/2018  Lower Venous Study  Indications: Pulmonary embolism.  Risk Factors: Confirmed PE. Performing Technologist: Toma Deiters RVS  Examination Guidelines: A complete evaluation includes B-mode imaging, spectral Doppler, color Doppler, and power Doppler as needed of all accessible portions of each vessel. Bilateral testing is considered an integral part of a complete examination. Limited examinations for reoccurring indications may be performed as noted.  Right Venous Findings: +---------+---------------+---------+-----------+----------+-------+           CompressibilityPhasicitySpontaneityPropertiesSummary +---------+---------------+---------+-----------+----------+-------+ CFV      Full           Yes      Yes                          +---------+---------------+---------+-----------+----------+-------+ SFJ      Full                                                 +---------+---------------+---------+-----------+----------+-------+ FV Prox  Full           Yes      Yes                          +---------+---------------+---------+-----------+----------+-------+ FV Mid   Full                                                 +---------+---------------+---------+-----------+----------+-------+ FV DistalFull           Yes      Yes                          +---------+---------------+---------+-----------+----------+-------+ PFV      Full           Yes      Yes                          +---------+---------------+---------+-----------+----------+-------+ POP      Full           Yes      Yes                          +---------+---------------+---------+-----------+----------+-------+ PTV      Full                                                 +---------+---------------+---------+-----------+----------+-------+ PERO     Full                                                 +---------+---------------+---------+-----------+----------+-------+  Left Venous Findings: +---------+---------------+---------+-----------+----------+-------+          CompressibilityPhasicitySpontaneityPropertiesSummary +---------+---------------+---------+-----------+----------+-------+ CFV      Full           Yes      Yes                          +---------+---------------+---------+-----------+----------+-------+  SFJ      Full                                                 +---------+---------------+---------+-----------+----------+-------+ FV Prox  Full           Yes      Yes                           +---------+---------------+---------+-----------+----------+-------+ FV Mid   Full                                                 +---------+---------------+---------+-----------+----------+-------+ FV DistalFull           Yes      Yes                          +---------+---------------+---------+-----------+----------+-------+ PFV      Full           Yes      Yes                          +---------+---------------+---------+-----------+----------+-------+ POP      Full           Yes      Yes                          +---------+---------------+---------+-----------+----------+-------+ PTV      Full                                                 +---------+---------------+---------+-----------+----------+-------+ PERO     Full                                                 +---------+---------------+---------+-----------+----------+-------+    Summary: Right: There is no evidence of deep vein thrombosis in the lower extremity. No cystic structure found in the popliteal fossa. Left: There is no evidence of deep vein thrombosis in the lower extremity. No cystic structure found in the popliteal fossa.  *See table(s) above for measurements and observations. Electronically signed by Waverly Ferrari MD on 11/05/2018 at 5:10:26 PM.    Final       Subjective: Patient reports feeling much better.  Left-sided pleuritic pain has significantly improved, rated at 2-3/10 which may worsen on taking deep breaths or coughing.  Occasional minimal blood in sputum, witnessed approximately half centimeter clot in emesis bag.  No dyspnea.  Able to breathe better.  Ambulating comfortably in the room.  Denies any other complaints.  Discharge Exam:  Vitals:   11/06/18 2302 11/07/18 0327 11/07/18 1028 11/07/18 1349  BP: 133/85 136/82  (!) 151/88  Pulse: 98 94  87  Resp: 16 16  18   Temp: 100.1 F (37.8 C) (!) 100.5 F (38.1 C) 98 F (36.7 C) 99.2 F (37.3 C)  TempSrc: Oral  Oral Oral Oral  SpO2: 95% 90%  99%  Weight:      Height:        General exam:  Pleasant young male, well built and nourished, seen ambulating comfortably in room. Respiratory system: Slightly diminished breath sounds in left base with occasional crackles.  Rest of lung fields clear to auscultation.  No pleural rub.  No increased work of breathing. Cardiovascular system: S1 & S2 heard, RRR. No JVD, murmurs, rubs, gallops or clicks. No pedal edema.  Telemetry personally reviewed: Sinus rhythm.  Gastrointestinal system: Abdomen is nondistended, soft and nontender. No organomegaly or masses felt. Normal bowel sounds heard.   Central nervous system: Alert and oriented. No focal neurological deficits. Extremities: Symmetric 5 x 5 power.  Left leg?  Appears slightly asymmetrically larger than right without any other acute findings but patient states that this is chronic for him. Skin: No rashes, lesions or ulcers Psychiatry: Judgement and insight appear normal. Mood & affect appropriate.     The results of significant diagnostics from this hospitalization (including imaging, microbiology, ancillary and laboratory) are listed below for reference.     Labs: CBC: Recent Labs  Lab 11/04/18 1409 11/05/18 0437 11/06/18 0522 11/07/18 0504  WBC 9.9 11.8* 9.1 7.3  HGB 14.1 13.3 12.9* 12.3*  HCT 44.4 41.2 40.2 38.4*  MCV 95.5 93.4 96.4 95.8  PLT 217 226 187 207   Basic Metabolic Panel: Recent Labs  Lab 11/04/18 1409 11/05/18 0437 11/06/18 0522 11/07/18 0504  NA 139 135 136 137  K 4.1 4.4 4.5 4.4  CL 103 98 100 101  CO2 28 27 28 27   GLUCOSE 102* 124* 110* 112*  BUN 14 14 15 13   CREATININE 1.30* 1.37* 1.38* 1.40*  CALCIUM 9.6 9.5 9.0 9.0   Cardiac Enzymes: Recent Labs  Lab 11/05/18 0001 11/05/18 0437 11/05/18 1121  TROPONINI <0.03 <0.03 <0.03   I discussed in detail with patient's fianc at bedside, updated care and answered all questions.  Time coordinating discharge: 40  minutes  SIGNED:  Marcellus Scott, MD, FACP, [facility]. [facility] Pager [phone]  If 7PM-7AM, please contact night-coverage [email or web address] Password [facility] 11/07/2018, 4:37 PM

## 2018-11-07 NOTE — Progress Notes (Signed)
SATURATION QUALIFICATIONS: (This note is used to comply with regulatory documentation for home oxygen)  Patient Saturations on Room Air at Rest = 96%  Patient Saturations on Room Air while Ambulating = 95%  

## 2018-11-07 NOTE — Progress Notes (Signed)
ANTICOAGULATION CONSULT NOTE -  Consult  Pharmacy Consult for IV heparin --> rivaroxaban Indication: pulmonary embolus  No Known Allergies  Patient Measurements: Height: 6\' 5"  (195.6 cm) Weight: 210 lb 12.2 oz (95.6 kg) IBW/kg (Calculated) : 89.1 Heparin Dosing Weight = TBW = 95 kg  Vital Signs: Temp: 98 F (36.7 C) (01/01 1028) Temp Source: Oral (01/01 1028) BP: 136/82 (01/01 0327) Pulse Rate: 94 (01/01 0327)  Labs: Recent Labs    11/04/18 2221 11/05/18 0001  11/05/18 0437 11/05/18 1121 11/06/18 0522 11/06/18 1237 11/06/18 2105 11/07/18 0504  HGB  --   --   --  13.3  --  12.9*  --   --  12.3*  HCT  --   --   --  41.2  --  40.2  --   --  38.4*  PLT  --   --   --  226  --  187  --   --  207  APTT 28  --   --   --   --   --   --   --   --   LABPROT 14.0  --   --   --   --   --   --   --   --   INR 1.09  --   --   --   --   --   --   --   --   HEPARINUNFRC  --   --    < > 0.50 0.53 0.36 0.30 0.41 0.32  CREATININE  --   --   --  1.37*  --  1.38*  --   --  1.40*  TROPONINI  --  <0.03  --  <0.03 <0.03  --   --   --   --    < > = values in this interval not displayed.    Estimated Creatinine Clearance: 89.3 mL/min (A) (by C-G formula based on SCr of 1.4 mg/dL (H)).   Medical History: Past Medical History:  Diagnosis Date  . Marijuana use     Medications:  Scheduled:  . rivaroxaban   Does not apply Once  . Rivaroxaban  15 mg Oral Once  . Rivaroxaban  15 mg Oral BID WC  . [START ON 11/28/2018] rivaroxaban  20 mg Oral Q supper  . sodium chloride flush  3 mL Intravenous Q12H   Infusions:    Assessment: 37 yoM c/o left CP that radiates to his neck.   CT + for LLL PE with right heart strain.  IV heparin for PE.  Baseline labs WNL  Significant Events: 12/31: Small amount of hemoptysis. MD aware and monitoring.  Today, 12/30  Pharmacy consulted to convert anticoagulation from heparin drip to rivaroxaban for VTE treatment. Currently in heparin infusion of 1650  units/hr  Hgb 12.3 - slightly decreased  Plt WNL, stable  No signs/symptoms of bleeding per discussion with RN  Goal of Therapy:  Heparin level 0.3-0.7 units/ml Monitor platelets by anticoagulation protocol: Yes   Plan:   Discontinue heparin drip at the time of rivaroxaban initiation  Rivaroxaban 15 mg PO BID with meals x 21 days followed by rivaroxaban 20 mg PO daily with supper.  CBC with AM labs tomorrow  Monitor for signs/symptoms of bleeding or thrombosis  Patient provided with rivaroxaban manufacturer coupon and education booklet. Medication counseling provided and questions answered.  Cindi Carbon, PharmD 11/07/18 11:33 AM

## 2018-11-07 NOTE — Care Management Note (Signed)
Case Management Note  Patient Details  Name: Moore Spizzirri MRN: [id number] Date of Birth: 1981/10/28  Subjective/Objective:  PE. From home-pives in [place]. Has no health insurance,or pcp-provided patient w/MATH Program for xarelto(can afford $3 co pay-informed of 1xuse/12 calendar months/$3 co pay/select pharmacies-patient will go to [facility] [address] open 24hrs today(CM called to confirm)-patient has all info for MATCH Program-aware to go directly after d/c to the pharmacy with the form.Also provide info for pcp-listings in [employer], & Dept Social Service for medicaid process. Provided patient w/patient asst program for xarelto. Patient voiced understanding with all info. No further CM needs.                 Action/Plan:d/c home MATCH program   Expected Discharge Date:                  Expected Discharge Plan:  Home/Self Care  In-House Referral:     Discharge planning Services  CM Consult, [facility]  Post Acute Care Choice:    Choice offered to:     DME Arranged:    DME Agency:     HH Arranged:    HH Agency:     Status of Service:  Completed, signed off  If discussed at [employer], dates discussed:    Additional Comments:  Lanier Clam, RN 11/07/2018, 11:45 AM

## 2019-04-22 IMAGING — DX DG CHEST 1V PORT
1 series · 1 of 1 positions shown · non-contrast
Comparison: Chest radiograph and CTA of the chest performed
11/04/2018

CLINICAL DATA: Acute onset of shortness of breath and left-sided
chest pain.

EXAM:
PORTABLE CHEST 1 VIEW

[chest ap]
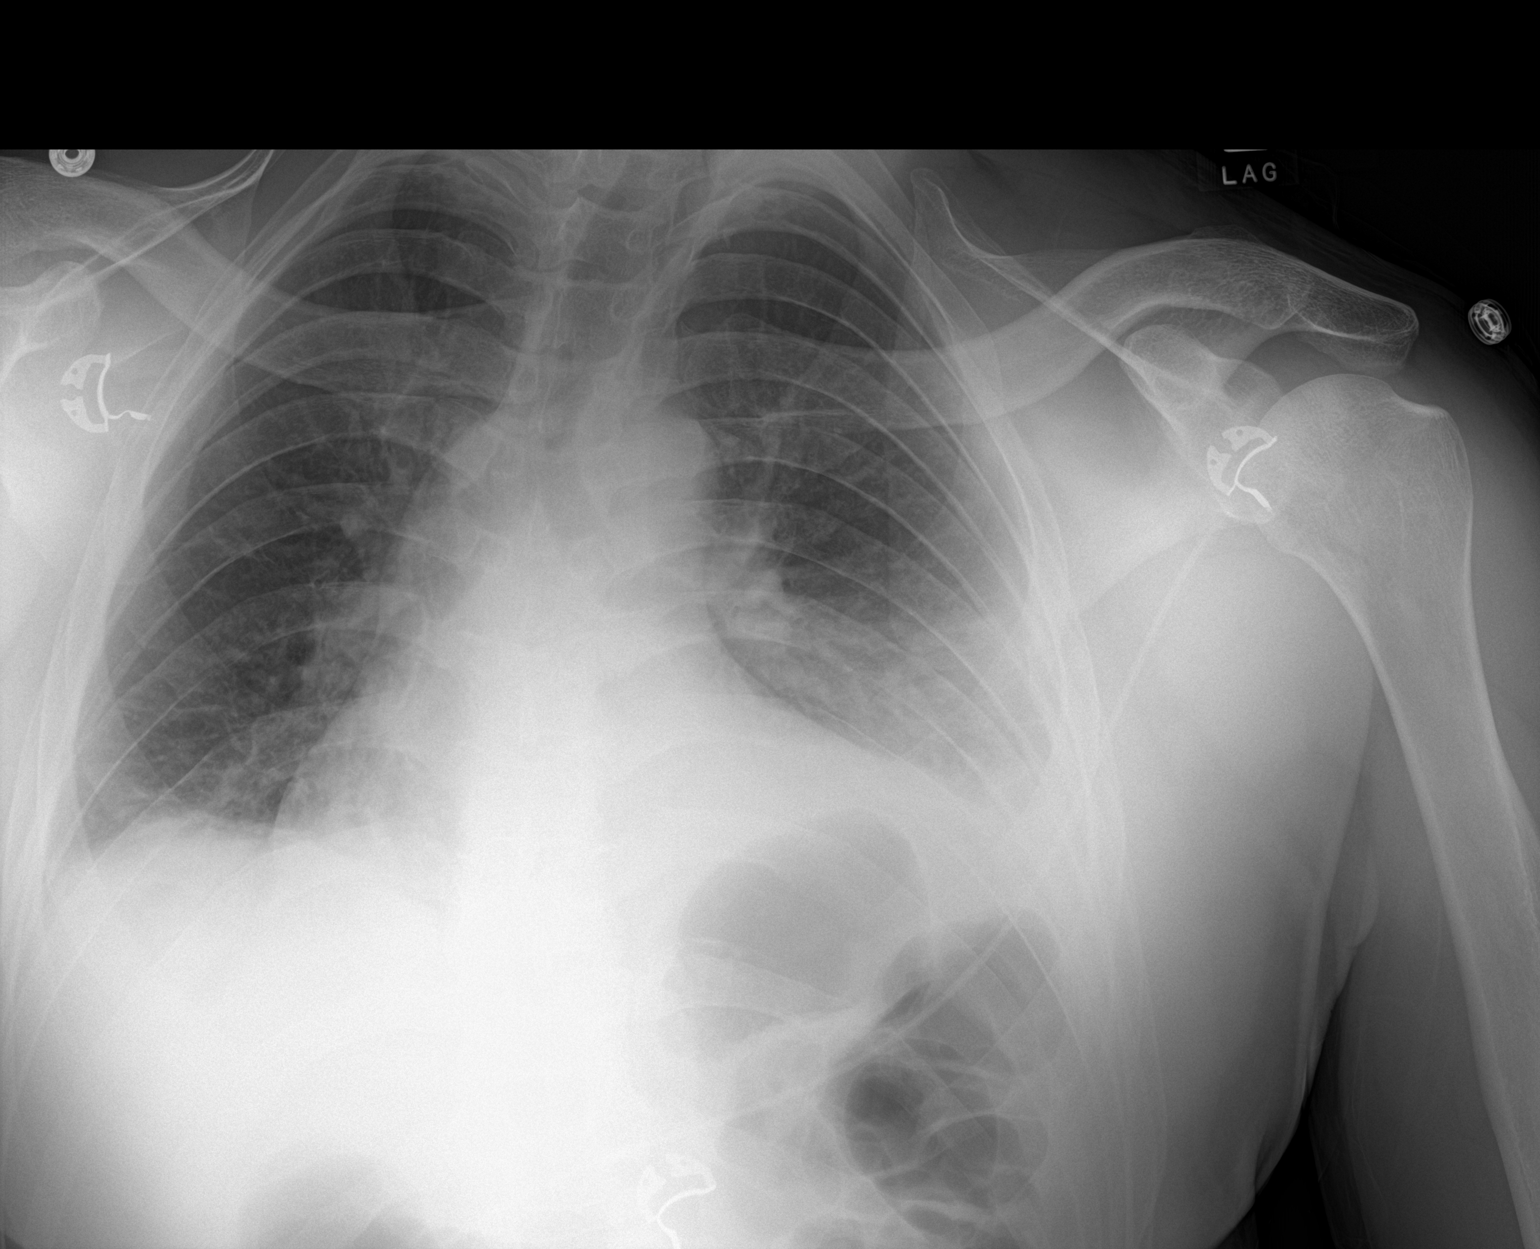

[1 of 1 positions shown; findings below may reference images not displayed]

FINDINGS: A small left pleural effusion is noted. Hazy left basilar airspace
opacity likely reflects mildly worsened pulmonary infarct, as on the
prior CTA. No pneumothorax is seen.

The cardiomediastinal silhouette is borderline enlarged. No acute
osseous abnormalities are identified.
IMPRESSION: Small left pleural effusion noted. Hazy left basilar airspace
opacity likely reflects mildly worsened pulmonary infarct, as on the
prior CTA. Borderline cardiomegaly.
